# Patient Record
Sex: Female | Born: 1995 | Race: White | Hispanic: No | Marital: Single | Smoking: Former smoker
Health system: Southern US, Community
[De-identification: ages and names within clinical notes are randomized; demographics above are authoritative.]

## PROBLEM LIST (undated history)

## (undated) ENCOUNTER — Inpatient Hospital Stay: Payer: Self-pay

## (undated) DIAGNOSIS — F32A Depression, unspecified: Secondary | ICD-10-CM

## (undated) DIAGNOSIS — T7840XA Allergy, unspecified, initial encounter: Secondary | ICD-10-CM

## (undated) DIAGNOSIS — J189 Pneumonia, unspecified organism: Secondary | ICD-10-CM

## (undated) DIAGNOSIS — N289 Disorder of kidney and ureter, unspecified: Secondary | ICD-10-CM

## (undated) DIAGNOSIS — J45909 Unspecified asthma, uncomplicated: Secondary | ICD-10-CM

## (undated) DIAGNOSIS — F419 Anxiety disorder, unspecified: Secondary | ICD-10-CM

## (undated) DIAGNOSIS — F1990 Other psychoactive substance use, unspecified, uncomplicated: Secondary | ICD-10-CM

## (undated) DIAGNOSIS — B1920 Unspecified viral hepatitis C without hepatic coma: Secondary | ICD-10-CM

## (undated) DIAGNOSIS — T50901A Poisoning by unspecified drugs, medicaments and biological substances, accidental (unintentional), initial encounter: Secondary | ICD-10-CM

## (undated) DIAGNOSIS — F329 Major depressive disorder, single episode, unspecified: Secondary | ICD-10-CM

## (undated) HISTORY — DX: Unspecified viral hepatitis C without hepatic coma: B19.20

## (undated) HISTORY — DX: Depression, unspecified: F32.A

## (undated) HISTORY — DX: Allergy, unspecified, initial encounter: T78.40XA

## (undated) HISTORY — DX: Major depressive disorder, single episode, unspecified: F32.9

## (undated) HISTORY — DX: Anxiety disorder, unspecified: F41.9

## (undated) HISTORY — PX: WISDOM TOOTH EXTRACTION: SHX21

---
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
## 2014-05-18 LAB — OB RESULTS CONSOLE HEPATITIS B SURFACE ANTIGEN: Hepatitis B Surface Ag: NEGATIVE

## 2014-05-18 LAB — OB RESULTS CONSOLE GC/CHLAMYDIA
CHLAMYDIA, DNA PROBE: NEGATIVE
GC PROBE AMP, GENITAL: NEGATIVE

## 2014-05-18 LAB — OB RESULTS CONSOLE ABO/RH: RH TYPE: POSITIVE

## 2014-05-18 LAB — OB RESULTS CONSOLE HIV ANTIBODY (ROUTINE TESTING): HIV: NONREACTIVE

## 2014-05-18 LAB — OB RESULTS CONSOLE RUBELLA ANTIBODY, IGM: Rubella: IMMUNE

## 2014-05-18 LAB — OB RESULTS CONSOLE VARICELLA ZOSTER ANTIBODY, IGG: Varicella: IMMUNE

## 2014-07-14 DIAGNOSIS — S069XAA Unspecified intracranial injury with loss of consciousness status unknown, initial encounter: Secondary | ICD-10-CM

## 2014-07-14 HISTORY — DX: Unspecified intracranial injury with loss of consciousness status unknown, initial encounter: S06.9XAA

## 2014-07-14 NOTE — L&D Delivery Note (Signed)
VAGINAL DELIVERY NOTE:  Date of Delivery: 12/30/2014 Primary OB: [facility]  Gestational Age/EDD: [redacted]w[redacted]d 01/01/2015, Date entered prior to episode creation Antepartum complications: Preclampsia Attending Physician: Annamarie Major, MD, FACOG Delivery Type: spontaneous vaginal delivery  Anesthesia: epidural Laceration: vaginal Episiotomy: none Placenta: spontaneous Intrapartum complications: PIH- Preclampsia Estimated Blood Loss: GBS: POS Procedure Details: NSVD, repair bilat internal vaginal small lacerations  Baby: Liveborn female, Apgars 8/9, weight 6-6 lbs

## 2014-09-01 DIAGNOSIS — S0181XA Laceration without foreign body of other part of head, initial encounter: Secondary | ICD-10-CM | POA: Insufficient documentation

## 2014-09-21 ENCOUNTER — Encounter
Admit: 2014-09-21 | Disposition: A | Discharge status: Home / Self Care | Payer: Self-pay | Attending: Obstetrics & Gynecology | Admitting: Obstetrics & Gynecology

## 2014-09-26 ENCOUNTER — Observation Stay: Payer: Self-pay | Admitting: Obstetrics and Gynecology

## 2014-09-26 DIAGNOSIS — O99891 Other specified diseases and conditions complicating pregnancy: Secondary | ICD-10-CM | POA: Insufficient documentation

## 2014-10-10 LAB — OB RESULTS CONSOLE RPR: RPR: NONREACTIVE

## 2014-10-10 LAB — OB RESULTS CONSOLE HIV ANTIBODY (ROUTINE TESTING): HIV: NONREACTIVE

## 2014-10-13 ENCOUNTER — Encounter
Admit: 2014-10-13 | Disposition: A | Discharge status: Home / Self Care | Payer: Self-pay | Attending: Obstetrics & Gynecology | Admitting: Obstetrics & Gynecology

## 2014-10-17 ENCOUNTER — Encounter
Admit: 2014-10-17 | Disposition: A | Discharge status: Home / Self Care | Payer: Self-pay | Attending: Obstetrics and Gynecology | Admitting: Obstetrics and Gynecology

## 2014-10-22 ENCOUNTER — Encounter (HOSPITAL_COMMUNITY): Payer: Self-pay | Admitting: *Deleted

## 2014-10-22 ENCOUNTER — Emergency Department (HOSPITAL_COMMUNITY)
Admission: EM | Admit: 2014-10-22 | Discharge: 2014-10-22 | Disposition: A | Discharge status: Home / Self Care | Payer: 59 | Attending: Emergency Medicine | Admitting: Emergency Medicine

## 2014-10-22 DIAGNOSIS — Z79899 Other long term (current) drug therapy: Secondary | ICD-10-CM | POA: Diagnosis not present

## 2014-10-22 DIAGNOSIS — R1030 Lower abdominal pain, unspecified: Secondary | ICD-10-CM | POA: Diagnosis present

## 2014-10-22 DIAGNOSIS — N859 Noninflammatory disorder of uterus, unspecified: Secondary | ICD-10-CM

## 2014-10-22 DIAGNOSIS — N858 Other specified noninflammatory disorders of uterus: Secondary | ICD-10-CM | POA: Insufficient documentation

## 2014-10-22 LAB — URINALYSIS, ROUTINE W REFLEX MICROSCOPIC
Bilirubin Urine: NEGATIVE
Glucose, UA: NEGATIVE mg/dL
KETONES UR: NEGATIVE mg/dL
LEUKOCYTES UA: NEGATIVE
Nitrite: NEGATIVE
Protein, ur: NEGATIVE mg/dL
Specific Gravity, Urine: 1.002 — ABNORMAL LOW (ref 1.005–1.030)
UROBILINOGEN UA: 0.2 mg/dL (ref 0.0–1.0)
pH: 6.5 (ref 5.0–8.0)

## 2014-10-22 LAB — URINE MICROSCOPIC-ADD ON

## 2014-10-22 MED ORDER — NIFEDIPINE 10 MG PO CAPS
10.0000 mg | ORAL_CAPSULE | Freq: Once | ORAL | Status: AC
  Administered 2014-10-22: 10 mg via ORAL
  Filled 2014-10-22: qty 1

## 2014-10-22 MED ORDER — HYDROCODONE-ACETAMINOPHEN 5-325 MG PO TABS: 1.0000 | ORAL_TABLET | Freq: Four times a day (QID) | ORAL | Status: DC | PRN

## 2014-10-22 MED ORDER — HYDROCODONE-ACETAMINOPHEN 5-325 MG PO TABS
1.0000 | ORAL_TABLET | Freq: Once | ORAL | Status: AC
  Administered 2014-10-22: 1 via ORAL
  Filled 2014-10-22: qty 1

## 2014-10-22 MED ORDER — SODIUM CHLORIDE 0.9 % IV BOLUS (SEPSIS)
1000.0000 mL | Freq: Once | INTRAVENOUS | Status: AC
  Administered 2014-10-22: 1000 mL via INTRAVENOUS

## 2014-10-22 NOTE — Progress Notes (Signed)
.  9% NS hung and infusing at 12825ml/hr.

## 2014-10-22 NOTE — Discharge Instructions (Signed)
Please follow up with your primary care physician in 1-2 days. If you do not have one please call the [facility] number listed above.Please follow up with your Ob/Gyn to schedule a follow up appointment.  Please take pain medication and/or muscle relaxants as prescribed and as needed for pain. Please do not drive on narcotic pain medication or on muscle relaxants. Please read all discharge instructions and return precautions.   Abdominal Pain During Pregnancy Abdominal pain is common in pregnancy. Most of the time, it does not cause harm. There are many causes of abdominal pain. Some causes are more serious than others. Some of the causes of abdominal pain in pregnancy are easily diagnosed. Occasionally, the diagnosis takes time to understand. Other times, the cause is not determined. Abdominal pain can be a sign that something is very wrong with the pregnancy, or the pain may have nothing to do with the pregnancy at all. For this reason, always tell your health care provider if you have any abdominal discomfort. HOME CARE INSTRUCTIONS  Monitor your abdominal pain for any changes. The following actions may help to alleviate any discomfort you are experiencing:  Do not have sexual intercourse or put anything in your vagina until your symptoms go away completely.  Get plenty of rest until your pain improves.  Drink clear fluids if you feel nauseous. Avoid solid food as long as you are uncomfortable or nauseous.  Only take over-the-counter or prescription medicine as directed by your health care provider.  Keep all follow-up appointments with your health care provider. SEEK IMMEDIATE MEDICAL CARE IF:  You are bleeding, leaking fluid, or passing tissue from the vagina.  You have increasing pain or cramping.  You have persistent vomiting.  You have painful or bloody urination.  You have a fever.  You notice a decrease in your baby's movements.  You have extreme weakness  or feel faint.  You have shortness of breath, with or without abdominal pain.  You develop a severe headache with abdominal pain.  You have abnormal vaginal discharge with abdominal pain.  You have persistent diarrhea.  You have abdominal pain that continues even after rest, or gets worse. MAKE SURE YOU:   Understand these instructions.  Will watch your condition.  Will get help right away if you are not doing well or get worse. Document Released: 06/30/2005 Document Revised: 04/20/2013 Document Reviewed: 01/27/2013 [facility] Patient Information 2015 [place]. This information is not intended to replace advice given to you by your health care provider. Make sure you discuss any questions you have with your health care provider.

## 2014-10-22 NOTE — Progress Notes (Signed)
Spoke with Dr. Shawnie PonsPratt. Pt's cervix is closed, thk, midline,and about a -1,0 station. Will watch pt for another 30 min to 1 hour, recheck her cervix and if unchanged, pt can be d/c.

## 2014-10-22 NOTE — Progress Notes (Signed)
Spoke with Dr. Shawnie PonsPratt. Pt has occasional ui. No uc's. FHR is a category 1 tracing. Pt's cervix is closed. Pt is OB cleared. She has an appointment with her OB on 4/ 12/16.

## 2014-10-22 NOTE — Progress Notes (Signed)
Spoke with Dr. Shawnie PonsPratt. Occasional ui. No uc's. Cervix is closed. FHR is a category1 tracing. Pt has an appointment with her OB on 10/24/14. Pt is OB cleared. To be dc'd home by ED staff.

## 2014-10-22 NOTE — Progress Notes (Signed)
Spoke with Dr. Shawnie PonsPratt. Pt still having a lot of irritability after a liter of fluids. Orders received for procardia 10mg  po now. Will continue to monitor.
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
## 2014-10-22 NOTE — Progress Notes (Signed)
Pt is a G1P0 at 6429 6/[redacted] weeks gestation with c/o lower abd pain and back pain. States she has had no vomiting or diarreah. Denies vaginal bleeding or leaking of fluid. Says she has had regular prenatal visits with her [place] in [place]. She is here today visiting her grandmother when her abd and back pain started.

## 2014-10-22 NOTE — Progress Notes (Signed)
IV being started by ED staff.

## 2014-10-22 NOTE — ED Notes (Signed)
Pt reports onset this am of intermittent lower back pain and abd pain. Reports vaginal discharge but no bleeding. Pt is approx [redacted] weeks pregnant.

## 2014-10-22 NOTE — Progress Notes (Signed)
Procardia 10mg po 

## 2014-10-23 ENCOUNTER — Emergency Department
Admit: 2014-10-23 | Disposition: A | Discharge status: Home / Self Care | Payer: Self-pay | Admitting: Emergency Medicine

## 2014-10-23 LAB — URINE CULTURE

## 2014-11-16 ENCOUNTER — Encounter: Payer: 59 | Admitting: Physical Therapy

## 2014-11-23 ENCOUNTER — Encounter: Payer: 59 | Admitting: Physical Therapy

## 2014-11-28 ENCOUNTER — Encounter: Payer: Self-pay | Admitting: *Deleted

## 2014-11-28 ENCOUNTER — Encounter: Payer: 59 | Attending: Advanced Practice Midwife | Admitting: *Deleted

## 2014-11-28 VITALS — BP 110/78 | Ht 59.0 in | Wt 130.0 lb

## 2014-11-28 DIAGNOSIS — O24410 Gestational diabetes mellitus in pregnancy, diet controlled: Secondary | ICD-10-CM

## 2014-11-28 NOTE — Patient Instructions (Signed)
Read book on Gestational Diabetes Follow Gestational Meal Planning Guidelines Complete a 3 day Food Record and bring to next appointment Avoid fruit juices and sugar sweetened beverages Check blood sugars 4 x day - before eating and 2 hrs after every meal and record Call MD for prescription for meter strips and lancets Strips (type) One Touch Ultra Blue Lancets   One Touch Delica Walk 20-30 minutes at least 5 times a week if permitted by MD Next appointment  Wed May 25 @ 1:30 pm with Elita QuickPam (Dietitian)

## 2014-11-28 NOTE — Progress Notes (Signed)
Diabetes Self-Management Education  Visit Type:    Appt. Start Time: 1400 Appt. End Time: 1530  11/28/2014  Ms. Carolyn BoardsZoe [name], identified by name and date of birth, is a 19 y.o. female with a diagnosis of Diabetes: Gestational Diabetes.    ASSESSMENT  Blood pressure 110/78, height 4\' 11"  (1.499 m), weight 130 lb (58.968 kg), last menstrual period 03/23/2014. Body mass index is 26.24 kg/(m^2).  Initial Visit Information:  Are you currently following a meal plan?: No   Are you taking your medications as prescribed?: Yes Are you checking your feet?: No   How often do you need to have someone help you when you read instructions, pamphlets, or other written materials from your doctor or pharmacy?: 1 - Never What is the last grade level you completed in school?: 12 + some college  Psychosocial:  Patient Belief/Attitude about Diabetes: Motivated to manage diabetes Self-care barriers: None Self-management support: Doctor's office, Friends Patient Concerns: Monitoring, Glycemic Control, Problem Solving, Healthy Lifestyle Special Needs: None Preferred Learning Style: Visual, Auditory  Complications:   How often do you check your blood sugar?: 0 times/day (not testing) Provided One Touch Ultra Mini meter and instructed on use. BG in office was 116 mg/dL at 1:613:15 pm - 2 hrs pp. Have you had a dilated eye exam in the past 12 months?: No Have you had a dental exam in the past 12 months?: No  Diet Intake:  Breakfast: eats cereal and milk for breakfast Dinner: eats 6-7  servings of fried foods per week Beverage(s): drinks fruit juices and sugar sweetened beverages    Exercise:  Exercise: Light (walking / raking leaves) Light Exercise amount of time (min / week): 90  Individualized Plan for Diabetes Self-Management Training:   Learning Objective:  Patient will have a greater understanding of diabetes self-management.  Education Topics Reviewed with Patient Today:  Definition of  diabetes, type 1 and 2, and the diagnosis of diabetes Role of diet in the treatment of diabetes and the relationship between the three main macronutrients and blood glucose level Role of exercise on diabetes management, blood pressure control and cardiac health.   Taught/evaluated SMBG meter., Purpose and frequency of SMBG., Taught/discussed recording of test results and interpretation of SMBG.    Identified and addressed patients feelings and concerns about diabetes Pregnancy and GDM  Role of pre-pregnancy blood glucose control on the development of the fetus, Reviewed with patient blood glucose goals with pregnancy    PATIENTS GOALS/Plan (Developed by the patient):  Improve blood sugars Prevent diabetes complications Lead a healthier lifestyle  Plan:   Patient Instructions  Read book on Gestational Diabetes Follow Gestational Meal Planning Guidelines Complete a 3 day Food Record and bring to next appointment Avoid fruit juices and sugar sweetened beverages Check blood sugars 4 x day - before eating and 2 hrs after every meal and record Call MD for prescription for meter strips and lancets Strips (type) One Touch Ultra Blue Lancets   One Touch Delica Walk 20-30 minutes at least 5 times a week if permitted by MD Next appointment  Wed May 25 @ 1:30 pm with Pam (Dietitian)    Expected Outcomes:  Demonstrated interest in learning. Expect positive outcomes  Education material provided: Gestational diabetes booklet, Gestational Meal Planning Guidelines, One Touch Ultra Mini meter  If problems or questions, patient to contact team via:   Sharion SettlerSheila Ashunti Schofield, RN, CCM, CDE [phone]  Future DSME appointment: Dec 06, 2014

## 2014-12-05 ENCOUNTER — Inpatient Hospital Stay
Admission: EM | Admit: 2014-12-05 | Discharge: 2014-12-05 | Disposition: A | Discharge status: Home / Self Care | Payer: 59 | Attending: Obstetrics & Gynecology | Admitting: Obstetrics & Gynecology

## 2014-12-05 DIAGNOSIS — Z3A36 36 weeks gestation of pregnancy: Secondary | ICD-10-CM | POA: Diagnosis not present

## 2014-12-05 DIAGNOSIS — O99343 Other mental disorders complicating pregnancy, third trimester: Secondary | ICD-10-CM | POA: Insufficient documentation

## 2014-12-05 DIAGNOSIS — F329 Major depressive disorder, single episode, unspecified: Secondary | ICD-10-CM | POA: Diagnosis not present

## 2014-12-05 DIAGNOSIS — O479 False labor, unspecified: Secondary | ICD-10-CM

## 2014-12-05 LAB — URINALYSIS COMPLETE WITH MICROSCOPIC (ARMC ONLY)
Bilirubin Urine: NEGATIVE
Glucose, UA: NEGATIVE mg/dL
Ketones, ur: NEGATIVE mg/dL
Nitrite: NEGATIVE
Protein, ur: 30 mg/dL — AB
SPECIFIC GRAVITY, URINE: 1.015 (ref 1.005–1.030)
Trans Epithel, UA: 1
pH: 7 (ref 5.0–8.0)

## 2014-12-05 NOTE — Progress Notes (Signed)
Obstetrics Admission History & Physical   Abdominal Cramping   HPI:  19 y.o. G1P0 @ 1374w1d (01/01/2015, Date entered prior to episode creation). Admitted on 12/05/2014 (HD ):   Patient Active Problem List   Diagnosis Date Noted  . Braxton Hicks contractions 12/05/2014    Presents for ctxs.  No VB or ROM.   PMHx:  Past Medical History  Diagnosis Date  . Allergy   . Depression    PSHx: No past surgical history on file. Medications:  Prescriptions prior to admission  Medication Sig Dispense Refill Last Dose  . FLUoxetine (PROZAC) 10 MG capsule Take 10 mg by mouth daily.   Taking  . loratadine (CLARITIN) 10 MG tablet Take 10 mg by mouth daily.   Taking  . PRENATAL 28-0.8 MG TABS Take 1 tablet by mouth daily.    Taking  . cyclobenzaprine (FLEXERIL) 5 MG tablet Take 5 mg by mouth every 8 (eight) hours as needed for muscle spasms.   0 Not Taking  . HYDROcodone-acetaminophen (NORCO/VICODIN) 5-325 MG per tablet Take 1-2 tablets by mouth every 6 (six) hours as needed for severe pain. (Patient not taking: Reported on 11/28/2014) 15 tablet 0 Not Taking   Allergies: is allergic to advair diskus; sulfa antibiotics; prednisone; and banana. OBHx:  OB History  Gravida Para Term Preterm AB SAB TAB Ectopic Multiple Living  1             # Outcome Date GA Lbr Len/2nd Weight Sex Delivery Anes PTL Lv  1 Current              GYNHx: History of abnormal pap smears: No.               History of STIs: No..  WUJ:WJXBJYNW/GNFAOZHYQMVHFHx:Negative/unremarkable except as detailed in HPI. Soc Hx: Alcohol: none  Objective:   Filed Vitals:   12/05/14 1726  Temp: 98.4 F (36.9 C)   General: Well nourished, well developed female in no acute distress.  Skin: Warm and dry.  Cardiovascular:Regular rate and rhythm. Respiratory: Clear to auscultation bilateral. Normal respiratory effort Abdomen: mild Neuro/Psych: Normal mood and affect.   Pelvic exam: is not limited by body habitus EGBUS: within normal limits Vagina: within  normal limits and with normal mucosa blood in the vault Cervix: not dilated, thick Uterus: Uterus demonstrates irritability pattern.  Adnexa: normal adnexa  EFM:FHR: 130 bpm, variability: moderate,  accelerations:  Present,  decelerations:  Absent Toco: irreg  UA neg for UTI  Assessment & Plan:   19 y.o. G1P0 @ 6274w1d (01/01/2015, Date entered prior to episode creation). Admitted on 12/05/2014 (HD ):    1. FWB: FHT category 1.  2. Pain management: Analgesia and epidural discussed. 3. ID: GBSnot done. Treat with ABX if positive. 4. Labor management: Home, f/u.  Labor precautions.

## 2014-12-05 NOTE — OB Triage Note (Signed)
Received pt to OBS 1 with c/o lower abd pain x 2 days but worsened at 1500 today, burning with voiding, and light pink/brownish discharge when wiping since arrival to unit. Pt states + fetal movement. Pt has several bruises noted on left elbow, right eye and under chin and states its from her pitt bull dog. Pt states she has not fallen or been hit.

## 2014-12-05 NOTE — Discharge Instructions (Signed)
Come to hospital if increase in vaginal bleeding, leaking fluid or worsening contractions.  Braxton Hicks Contractions Contractions of the uterus can occur throughout pregnancy. Contractions are not always a sign that you are in labor.  WHAT ARE BRAXTON HICKS CONTRACTIONS?  Contractions that occur before labor are called Braxton Hicks contractions, or false labor. Toward the end of pregnancy (32-34 weeks), these contractions can develop more often and may become more forceful. This is not true labor because these contractions do not result in opening (dilatation) and thinning of the cervix. They are sometimes difficult to tell apart from true labor because these contractions can be forceful and people have different pain tolerances. You should not feel embarrassed if you go to the hospital with false labor. Sometimes, the only way to tell if you are in true labor is for your health care provider to look for changes in the cervix. If there are no prenatal problems or other health problems associated with the pregnancy, it is completely safe to be sent home with false labor and await the onset of true labor. HOW CAN YOU TELL THE DIFFERENCE BETWEEN TRUE AND FALSE LABOR? False Labor  The contractions of false labor are usually shorter and not as hard as those of true labor.   The contractions are usually irregular.   The contractions are often felt in the front of the lower abdomen and in the groin.   The contractions may go away when you walk around or change positions while lying down.   The contractions get weaker and are shorter lasting as time goes on.   The contractions do not usually become progressively stronger, regular, and closer together as with true labor.  True Labor  Contractions in true labor last 30-70 seconds, become very regular, usually become more intense, and increase in frequency.   The contractions do not go away with walking.   The discomfort is usually felt in  the top of the uterus and spreads to the lower abdomen and low back.   True labor can be determined by your health care provider with an exam. This will show that the cervix is dilating and getting thinner.  WHAT TO REMEMBER  Keep up with your usual exercises and follow other instructions given by your health care provider.   Take medicines as directed by your health care provider.   Keep your regular prenatal appointments.   Eat and drink lightly if you think you are going into labor.   If Braxton Hicks contractions are making you uncomfortable:   Change your position from lying down or resting to walking, or from walking to resting.   Sit and rest in a tub of warm water.   Drink 2-3 glasses of water. Dehydration may cause these contractions.   Do slow and deep breathing several times an hour.  WHEN SHOULD I SEEK IMMEDIATE MEDICAL CARE? Seek immediate medical care if:  Your contractions become stronger, more regular, and closer together.   You have fluid leaking or gushing from your vagina.   You have a fever.   You pass blood-tinged mucus.   You have vaginal bleeding.   You have continuous abdominal pain.   You have low back pain that you never had before.   You feel your baby's head pushing down and causing pelvic pressure.   Your baby is not moving as much as it used to.  Document Released: 06/30/2005 Document Revised: 07/05/2013 Document Reviewed: 04/11/2013 [facility] Patient Information 2015 [employer], [place]. This  information is not intended to replace advice given to you by your health care provider. Make sure you discuss any questions you have with your health care provider. ° °

## 2014-12-06 ENCOUNTER — Encounter: Payer: Self-pay | Admitting: Dietician

## 2014-12-06 ENCOUNTER — Encounter: Payer: 59 | Admitting: Dietician

## 2014-12-06 VITALS — BP 108/76 | Ht 59.0 in | Wt 130.4 lb

## 2014-12-06 DIAGNOSIS — O24410 Gestational diabetes mellitus in pregnancy, diet controlled: Secondary | ICD-10-CM | POA: Diagnosis not present

## 2014-12-06 NOTE — Progress Notes (Signed)
Patient's BG record indicates BGs within goal ranges. She had one low reading of 56 (fasting), states she felt nauseated until eating.     Patient's food diary indicates improved dietary intake, pt is making low fat choices and avoiding fried foods; she is avoiding beverages with sugar.   Provided 1800 kcal meal plan, and wrote individualized menus based on patient's food preferences. Instructed patient on food safety, including avoidance of Listeriosis, and limiting mercury from fish. Discussed importance of maintaining healthy lifestyle habits to reduce risk of Type 2 DM as well as Gestational DM with any future pregnancies. Advised patient to use any remaining testing supplies to test some BGs after delivery, and to have BG tested ideally annually, as well as prior to attempting future pregnancies.

## 2014-12-06 NOTE — Patient Instructions (Signed)
Include a protein food such as cheese or nuts with each meal and snack. Can increase carbohydrate intake to 3 servings with each meal; use food lists for options.  Eat something every 3-5 hours, avoid going longer than 5-6 hours without anything to eat.

## 2014-12-28 ENCOUNTER — Inpatient Hospital Stay
Admission: EM | Admit: 2014-12-28 | Discharge: 2014-12-28 | Disposition: A | Discharge status: Home / Self Care | Payer: 59 | Source: Home / Self Care | Admitting: Obstetrics & Gynecology

## 2014-12-28 DIAGNOSIS — Z3A39 39 weeks gestation of pregnancy: Secondary | ICD-10-CM | POA: Diagnosis present

## 2014-12-28 DIAGNOSIS — Z8249 Family history of ischemic heart disease and other diseases of the circulatory system: Secondary | ICD-10-CM

## 2014-12-28 DIAGNOSIS — Z79899 Other long term (current) drug therapy: Secondary | ICD-10-CM

## 2014-12-28 DIAGNOSIS — O24429 Gestational diabetes mellitus in childbirth, unspecified control: Secondary | ICD-10-CM | POA: Diagnosis present

## 2014-12-28 DIAGNOSIS — O99824 Streptococcus B carrier state complicating childbirth: Secondary | ICD-10-CM | POA: Diagnosis present

## 2014-12-28 DIAGNOSIS — O99344 Other mental disorders complicating childbirth: Secondary | ICD-10-CM | POA: Diagnosis present

## 2014-12-28 DIAGNOSIS — Z888 Allergy status to other drugs, medicaments and biological substances status: Secondary | ICD-10-CM

## 2014-12-28 DIAGNOSIS — O479 False labor, unspecified: Secondary | ICD-10-CM

## 2014-12-28 DIAGNOSIS — Z882 Allergy status to sulfonamides status: Secondary | ICD-10-CM

## 2014-12-28 DIAGNOSIS — O1493 Unspecified pre-eclampsia, third trimester: Principal | ICD-10-CM | POA: Diagnosis present

## 2014-12-28 DIAGNOSIS — F329 Major depressive disorder, single episode, unspecified: Secondary | ICD-10-CM | POA: Diagnosis present

## 2014-12-28 NOTE — Discharge Summary (Signed)
Physician Discharge Summary   Patient ID: Carolyn [name] [id number] 19 y.o. 19-Jul-1995  Admit date: 12/28/2014  Discharge date and time: No discharge date for patient encounter.   Admitting Physician: Nadara Mustard, MD   Discharge Physician: Tiburcio Pea  Admission Diagnoses: 39 wks preg contractions bleeding  Discharge Diagnoses: False Labor, Abdominal Pain  Admission Condition: good  Discharged Condition: good  Indication for Admission: Pregnant in pain  Hospital Course: NST, Monitoring, Cervical exams  Consults: None  Significant Diagnostic Studies: none  Treatments: hydration  Discharge Exam: LMP 03/23/2014  General Appearance:    Alert, cooperative, no distress, appears stated age  Head:    Normocephalic, without obvious abnormality, atraumatic  Eyes:    PERRL, conjunctiva/corneas clear, EOM's intact, fundi    benign, both eyes  Ears:    Normal TM's and external ear canals, both ears  Nose:   Nares normal, septum midline, mucosa normal, no drainage    or sinus tenderness  Throat:   Lips, mucosa, and tongue normal; teeth and gums normal  Neck:   Supple, symmetrical, trachea midline, no adenopathy;    thyroid:  no enlargement/tenderness/nodules; no carotid   bruit or JVD  Back:     Symmetric, no curvature, ROM normal, no CVA tenderness  Lungs:     Clear to auscultation bilaterally, respirations unlabored  Chest Wall:    No tenderness or deformity   [name]:    Regular rate and rhythm, S1 and S2 normal, no murmur, rub   or gallop  Breast Exam:    No tenderness, masses, or nipple abnormality  Abdomen:     Soft, non-tender, bowel sounds active all four quadrants,    no masses, no organomegaly  Genitalia:    Normal female without lesion, discharge or tenderness  Rectal:    Normal tone, normal prostate, no masses or tenderness;   guaiac negative stool  Extremities:   Extremities normal, atraumatic, no cyanosis or edema  Pulses:   2+ and symmetric all extremities  Skin:    Skin color, texture, turgor normal, no rashes or lesions  Lymph nodes:   Cervical, supraclavicular, and axillary nodes normal  Neurologic:   CNII-XII intact, normal strength, sensation and reflexes    throughout    Disposition: 01-Home or Self Care  Patient Instructions:    Medication List    ASK your doctor about these medications        cyclobenzaprine 5 MG tablet  Commonly known as:  FLEXERIL  Take 5 mg by mouth every 8 (eight) hours as needed for muscle spasms.     FLUoxetine 10 MG capsule  Commonly known as:  PROZAC  Take 10 mg by mouth daily.     HYDROcodone-acetaminophen 5-325 MG per tablet  Commonly known as:  NORCO/VICODIN  Take 1-2 tablets by mouth every 6 (six) hours as needed for severe pain.     loratadine 10 MG tablet  Commonly known as:  CLARITIN  Take 10 mg by mouth daily.     PRENATAL 28-0.8 MG Tabs  Take 1 tablet by mouth daily.       Activity: activity as tolerated Diet: regular diet Wound Care: none needed  Follow-up within 4days   Signed: Zulema Pulaski PAUL 12/28/2014 2:24 PM

## 2014-12-28 NOTE — H&P (Signed)
Obstetrics Admission History & Physical   No chief complaint on file.   HPI:  19 y.o. G1P0 @ [redacted]w[redacted]d (01/01/2015, Date entered prior to episode creation). Admitted on 12/28/2014:   Patient Active Problem List   Diagnosis Date Noted  . Braxton Hicks contractions 12/05/2014     Presents for contractions.  Prenatal care at: at [facility]  PMHx:  Past Medical History  Diagnosis Date  . Allergy   . Depression    PSHx: No past surgical history on file. Medications:  Prescriptions prior to admission  Medication Sig Dispense Refill Last Dose  . cyclobenzaprine (FLEXERIL) 5 MG tablet Take 5 mg by mouth every 8 (eight) hours as needed for muscle spasms.   0 Not Taking  . FLUoxetine (PROZAC) 10 MG capsule Take 10 mg by mouth daily.   Taking  . HYDROcodone-acetaminophen (NORCO/VICODIN) 5-325 MG per tablet Take 1-2 tablets by mouth every 6 (six) hours as needed for severe pain. (Patient not taking: Reported on 11/28/2014) 15 tablet 0 Not Taking  . loratadine (CLARITIN) 10 MG tablet Take 10 mg by mouth daily.   Taking  . PRENATAL 28-0.8 MG TABS Take 1 tablet by mouth daily.    Taking   Allergies: is allergic to advair diskus; sulfa antibiotics; prednisone; and banana. OBHx:  OB History  Gravida Para Term Preterm AB SAB TAB Ectopic Multiple Living  1             # Outcome Date GA Lbr Len/2nd Weight Sex Delivery Anes PTL Lv  1 Current              KFE:XMDYJWLK/HVFMBBUYZJQD except as detailed in HPI. Soc Hx: Pregnancy welcomed  Objective:  There were no vitals filed for this visit. General: Well nourished, well developed female in no acute distress.  Skin: Warm and dry.  Cardiovascular:Regular rate and rhythm. Respiratory: Clear to auscultation bilateral. Normal respiratory effort Abdomen: mild Neuro/Psych: Normal mood and affect.   Pelvic exam: is not limited by body habitus EGBUS: within normal limits Vagina: within normal limits and with normal mucosa blood in the vault Cervix:  <0.5 cm Uterus: Uterus demonstrates irritability pattern.  Adnexa: normal adnexa  EFM:FHR: 140 bpm, variability: moderate,  accelerations:  Present,  decelerations:  Absent Toco: None   Perinatal info:  Blood type: O positive Rubella- Immune Varicella -Immune TDaP Given during third trimester of this pregnancy RPR NR / HIV Neg/ HBsAg Neg   Assessment & Plan:   19 y.o. G1P0 @ [redacted]w[redacted]d, Admitted on 12/28/2014: abdominal pain, contractions.     Observe for cervical change and Fetal Wellbeing Reassuring

## 2014-12-29 ENCOUNTER — Inpatient Hospital Stay
Admission: EM | Admit: 2014-12-29 | Discharge: 2015-01-01 | DRG: 774 | Disposition: A | Discharge status: Home / Self Care | Payer: 59 | Attending: Obstetrics & Gynecology | Admitting: Obstetrics & Gynecology

## 2014-12-29 DIAGNOSIS — O1414 Severe pre-eclampsia complicating childbirth: Secondary | ICD-10-CM

## 2014-12-29 DIAGNOSIS — R1084 Generalized abdominal pain: Secondary | ICD-10-CM

## 2014-12-29 DIAGNOSIS — R109 Unspecified abdominal pain: Secondary | ICD-10-CM | POA: Diagnosis present

## 2014-12-29 DIAGNOSIS — Z8759 Personal history of other complications of pregnancy, childbirth and the puerperium: Secondary | ICD-10-CM

## 2014-12-29 LAB — COMPREHENSIVE METABOLIC PANEL
ALT: 11 U/L — ABNORMAL LOW (ref 14–54)
AST: 28 U/L (ref 15–41)
Albumin: 3.1 g/dL — ABNORMAL LOW (ref 3.5–5.0)
Alkaline Phosphatase: 350 U/L — ABNORMAL HIGH (ref 38–126)
Anion gap: 8 (ref 5–15)
BILIRUBIN TOTAL: 0.2 mg/dL — AB (ref 0.3–1.2)
BUN: 6 mg/dL (ref 6–20)
CO2: 22 mmol/L (ref 22–32)
CREATININE: 0.61 mg/dL (ref 0.44–1.00)
Calcium: 8.8 mg/dL — ABNORMAL LOW (ref 8.9–10.3)
Chloride: 105 mmol/L (ref 101–111)
GFR calc Af Amer: >60 mL/min (ref >60)
Glucose, Bld: 84 mg/dL (ref 65–99)
POTASSIUM: 3.4 mmol/L — AB (ref 3.5–5.1)
SODIUM: 135 mmol/L (ref 135–145)
Total Protein: 6.4 g/dL — ABNORMAL LOW (ref 6.5–8.1)

## 2014-12-29 LAB — CBC
HEMATOCRIT: 38 % (ref 35.0–47.0)
HEMOGLOBIN: 12.4 g/dL (ref 12.0–16.0)
MCH: 27.7 pg (ref 26.0–34.0)
MCHC: 32.7 g/dL (ref 32.0–36.0)
MCV: 84.9 fL (ref 80.0–100.0)
Platelets: 166 10*3/uL (ref 150–440)
RBC: 4.47 MIL/uL (ref 3.80–5.20)
RDW: 15.7 % — ABNORMAL HIGH (ref 11.5–14.5)
WBC: 12.1 10*3/uL — AB (ref 3.6–11.0)

## 2014-12-29 LAB — OB RESULTS CONSOLE GBS: STREP GROUP B AG: POSITIVE
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
## 2014-12-29 NOTE — OB Triage Note (Signed)
Patient complaining of contractions since 1800.  Five mins apart

## 2014-12-30 ENCOUNTER — Inpatient Hospital Stay: Payer: 59 | Admitting: Anesthesiology

## 2014-12-30 DIAGNOSIS — Z79899 Other long term (current) drug therapy: Secondary | ICD-10-CM | POA: Diagnosis not present

## 2014-12-30 DIAGNOSIS — Z888 Allergy status to other drugs, medicaments and biological substances status: Secondary | ICD-10-CM | POA: Diagnosis not present

## 2014-12-30 DIAGNOSIS — O24429 Gestational diabetes mellitus in childbirth, unspecified control: Secondary | ICD-10-CM | POA: Diagnosis present

## 2014-12-30 DIAGNOSIS — O99824 Streptococcus B carrier state complicating childbirth: Secondary | ICD-10-CM | POA: Diagnosis present

## 2014-12-30 DIAGNOSIS — Z882 Allergy status to sulfonamides status: Secondary | ICD-10-CM | POA: Diagnosis not present

## 2014-12-30 DIAGNOSIS — O99344 Other mental disorders complicating childbirth: Secondary | ICD-10-CM | POA: Diagnosis present

## 2014-12-30 DIAGNOSIS — Z8249 Family history of ischemic heart disease and other diseases of the circulatory system: Secondary | ICD-10-CM | POA: Diagnosis not present

## 2014-12-30 DIAGNOSIS — Z3A39 39 weeks gestation of pregnancy: Secondary | ICD-10-CM | POA: Diagnosis present

## 2014-12-30 DIAGNOSIS — O1493 Unspecified pre-eclampsia, third trimester: Secondary | ICD-10-CM | POA: Diagnosis present

## 2014-12-30 DIAGNOSIS — F329 Major depressive disorder, single episode, unspecified: Secondary | ICD-10-CM | POA: Diagnosis present

## 2014-12-30 LAB — TYPE AND SCREEN
ABO/RH(D): O POS
ANTIBODY SCREEN: NEGATIVE

## 2014-12-30 LAB — URINE DRUG SCREEN, QUALITATIVE (ARMC ONLY)
AMPHETAMINES, UR SCREEN: NOT DETECTED
Barbiturates, Ur Screen: NOT DETECTED
Benzodiazepine, Ur Scrn: NOT DETECTED
CANNABINOID 50 NG, UR ~~LOC~~: NOT DETECTED
Cocaine Metabolite,Ur ~~LOC~~: NOT DETECTED
MDMA (Ecstasy)Ur Screen: NOT DETECTED
METHADONE SCREEN, URINE: NOT DETECTED
Opiate, Ur Screen: NOT DETECTED
Phencyclidine (PCP) Ur S: NOT DETECTED
Tricyclic, Ur Screen: NOT DETECTED

## 2014-12-30 LAB — PLATELET COUNT: Platelets: 169 10*3/uL (ref 150–440)

## 2014-12-30 LAB — PROTEIN / CREATININE RATIO, URINE
Creatinine, Urine: 85 mg/dL
Protein Creatinine Ratio: 0.6 mg/mg{Cre} — ABNORMAL HIGH (ref 0.00–0.15)
Total Protein, Urine: 51 mg/dL

## 2014-12-30 LAB — ABO/RH: ABO/RH(D): O POS

## 2014-12-30 MED ORDER — TERBUTALINE SULFATE 1 MG/ML IJ SOLN: 0.2500 mg | Freq: Once | INTRAMUSCULAR | Status: AC | PRN

## 2014-12-30 MED ORDER — LIDOCAINE HCL (PF) 1 % IJ SOLN: 30.0000 mL | INTRAMUSCULAR | Status: DC | PRN

## 2014-12-30 MED ORDER — BUTORPHANOL TARTRATE 1 MG/ML IJ SOLN
1.0000 mg | INTRAMUSCULAR | Status: DC | PRN
  Administered 2014-12-30 (×3): 1 mg via INTRAVENOUS

## 2014-12-30 MED ORDER — BUTORPHANOL TARTRATE 1 MG/ML IJ SOLN
INTRAMUSCULAR | Status: AC
  Administered 2014-12-30: 1 mg via INTRAVENOUS
  Filled 2014-12-30: qty 1

## 2014-12-30 MED ORDER — OXYTOCIN 40 UNITS IN LACTATED RINGERS INFUSION - SIMPLE MED
1.0000 m[IU]/min | INTRAVENOUS | Status: DC
  Administered 2014-12-30: 11 m[IU]/min via INTRAVENOUS
  Administered 2014-12-30: 1 m[IU]/min via INTRAVENOUS
  Administered 2014-12-30: 13 m[IU]/min via INTRAVENOUS

## 2014-12-30 MED ORDER — ACETAMINOPHEN 325 MG PO TABS: 650.0000 mg | ORAL_TABLET | ORAL | Status: DC | PRN

## 2014-12-30 MED ORDER — LIDOCAINE HCL (PF) 1 % IJ SOLN
INTRAMUSCULAR | Status: AC
  Filled 2014-12-30: qty 30

## 2014-12-30 MED ORDER — SODIUM CHLORIDE 0.9 % IV SOLN
1.0000 g | INTRAVENOUS | Status: DC
  Administered 2014-12-30 (×4): 1 g via INTRAVENOUS

## 2014-12-30 MED ORDER — DIPHENHYDRAMINE HCL 50 MG/ML IJ SOLN: 12.5000 mg | INTRAMUSCULAR | Status: DC | PRN

## 2014-12-30 MED ORDER — SODIUM CHLORIDE 0.9 % IV SOLN
2.0000 g | Freq: Once | INTRAVENOUS | Status: AC
  Administered 2014-12-30: 2 g via INTRAVENOUS

## 2014-12-30 MED ORDER — AMMONIA AROMATIC IN INHA
RESPIRATORY_TRACT | Status: AC
Start: 2014-12-30 — End: 2014-12-31
  Filled 2014-12-30: qty 10

## 2014-12-30 MED ORDER — OXYTOCIN 40 UNITS IN LACTATED RINGERS INFUSION - SIMPLE MED
62.5000 mL/h | INTRAVENOUS | Status: DC
  Administered 2014-12-30: 999 mL/h via INTRAVENOUS
  Administered 2014-12-30: 62.5 mL/h via INTRAVENOUS

## 2014-12-30 MED ORDER — MAGNESIUM SULFATE 50 % IJ SOLN
2.0000 g/h | INTRAVENOUS | Status: DC
  Administered 2014-12-30 (×2): 2 g/h via INTRAVENOUS
  Filled 2014-12-30: qty 80

## 2014-12-30 MED ORDER — EPHEDRINE 5 MG/ML INJ: 10.0000 mg | INTRAVENOUS | Status: DC | PRN

## 2014-12-30 MED ORDER — BUTORPHANOL TARTRATE 1 MG/ML IJ SOLN
INTRAMUSCULAR | Status: AC
  Filled 2014-12-30: qty 1

## 2014-12-30 MED ORDER — MAGNESIUM SULFATE 40 G IN LACTATED RINGERS - SIMPLE
INTRAVENOUS | Status: AC
  Filled 2014-12-30: qty 500

## 2014-12-30 MED ORDER — ONDANSETRON HCL 4 MG/2ML IJ SOLN
4.0000 mg | Freq: Four times a day (QID) | INTRAMUSCULAR | Status: DC | PRN
  Administered 2014-12-30: 4 mg via INTRAVENOUS

## 2014-12-30 MED ORDER — HYDRALAZINE HCL 20 MG/ML IJ SOLN: 10.0000 mg | INTRAMUSCULAR | Status: DC | PRN

## 2014-12-30 MED ORDER — LACTATED RINGERS IV SOLN: 500.0000 mL | INTRAVENOUS | Status: DC | PRN

## 2014-12-30 MED ORDER — SODIUM CHLORIDE 0.9 % IV SOLN
INTRAVENOUS | Status: AC
  Filled 2014-12-30: qty 1000

## 2014-12-30 MED ORDER — FENTANYL 2.5 MCG/ML BUPIVACAINE 1/10 % EPIDURAL INFUSION (WH - ANES): 10.0000 mL/h | INTRAMUSCULAR | Status: DC | PRN

## 2014-12-30 MED ORDER — PHENYLEPHRINE 40 MCG/ML (10ML) SYRINGE FOR IV PUSH (FOR BLOOD PRESSURE SUPPORT): 80.0000 ug | PREFILLED_SYRINGE | INTRAVENOUS | Status: DC | PRN

## 2014-12-30 MED ORDER — ONDANSETRON HCL 4 MG/2ML IJ SOLN
INTRAMUSCULAR | Status: AC
  Administered 2014-12-30: 4 mg via INTRAVENOUS
  Filled 2014-12-30: qty 2

## 2014-12-30 MED ORDER — FENTANYL 2.5 MCG/ML W/ROPIVACAINE 0.2% IN NS 100 ML EPIDURAL INFUSION (ARMC-ANES): 10.0000 mL/h | EPIDURAL | Status: DC

## 2014-12-30 MED ORDER — OXYTOCIN 40 UNITS IN LACTATED RINGERS INFUSION - SIMPLE MED
INTRAVENOUS | Status: AC
  Administered 2014-12-30: 11 m[IU]/min via INTRAVENOUS
  Filled 2014-12-30: qty 1000

## 2014-12-30 MED ORDER — MISOPROSTOL 200 MCG PO TABS
ORAL_TABLET | ORAL | Status: AC
  Filled 2014-12-30: qty 4

## 2014-12-30 MED ORDER — FENTANYL 2.5 MCG/ML W/ROPIVACAINE 0.2% IN NS 100 ML EPIDURAL INFUSION (ARMC-ANES)
EPIDURAL | Status: AC
  Administered 2014-12-30: 10 mL/h via EPIDURAL
  Filled 2014-12-30: qty 100

## 2014-12-30 MED ORDER — OXYTOCIN 10 UNIT/ML IJ SOLN
INTRAMUSCULAR | Status: AC
  Filled 2014-12-30: qty 2

## 2014-12-30 MED ORDER — SODIUM CHLORIDE 0.9 % IV SOLN
INTRAVENOUS | Status: AC
  Administered 2014-12-30: 2 g via INTRAVENOUS
  Filled 2014-12-30: qty 2000

## 2014-12-30 MED ORDER — OXYTOCIN BOLUS FROM INFUSION: 500.0000 mL | INTRAVENOUS | Status: DC

## 2014-12-30 MED ORDER — AMPICILLIN SODIUM 1 G IJ SOLR
INTRAMUSCULAR | Status: AC
  Filled 2014-12-30: qty 1000

## 2014-12-30 MED ORDER — DINOPROSTONE 10 MG VA INST
10.0000 mg | VAGINAL_INSERT | Freq: Once | VAGINAL | Status: AC
  Administered 2014-12-30: 10 mg via VAGINAL
  Filled 2014-12-30: qty 1

## 2014-12-30 MED ORDER — MAGNESIUM SULFATE BOLUS VIA INFUSION
6.0000 g | Freq: Once | INTRAVENOUS | Status: AC
  Administered 2014-12-30: 6 g via INTRAVENOUS
  Filled 2014-12-30: qty 500

## 2014-12-30 MED ORDER — CITRIC ACID-SODIUM CITRATE 334-500 MG/5ML PO SOLN: 30.0000 mL | ORAL | Status: DC | PRN

## 2014-12-30 MED ORDER — HYDRALAZINE HCL 20 MG/ML IJ SOLN: 5.0000 mg | Freq: Once | INTRAMUSCULAR | Status: AC | PRN

## 2014-12-30 MED ORDER — SODIUM CHLORIDE 0.9 % IV SOLN
INTRAVENOUS | Status: AC
  Administered 2014-12-30: 1 g via INTRAVENOUS
  Filled 2014-12-30: qty 1000

## 2014-12-30 MED ORDER — LACTATED RINGERS IV SOLN
INTRAVENOUS | Status: DC
  Administered 2014-12-30 (×2): via INTRAVENOUS

## 2014-12-30 NOTE — Anesthesia Preprocedure Evaluation (Signed)
Anesthesia Evaluation  Patient identified by MRN, date of birth, ID band Patient awake    Reviewed: Allergy & Precautions, Patient's Chart, lab work & pertinent test results  History of Anesthesia Complications Negative for: history of anesthetic complications  Airway Mallampati: II  TM Distance: >3 FB Neck ROM: Full    Dental  (+) Teeth Intact   Pulmonary          Cardiovascular hypertension (PIH),     Neuro/Psych Depression    GI/Hepatic   Endo/Other  diabetes, Gestational  Renal/GU      Musculoskeletal   Abdominal   Peds  Hematology   Anesthesia Other Findings   Reproductive/Obstetrics (+) Pregnancy                             Anesthesia Physical Anesthesia Plan  ASA: II  Anesthesia Plan: Epidural   Post-op Pain Management:    Induction:   Airway Management Planned:   Additional Equipment:   Intra-op Plan:   Post-operative Plan:   Informed Consent: I have reviewed the patients History and Physical, chart, labs and discussed the procedure including the risks, benefits and alternatives for the proposed anesthesia with the patient or authorized representative who has indicated his/her understanding and acceptance.     Plan Discussed with:   Anesthesia Plan Comments:         Anesthesia Quick Evaluation

## 2014-12-30 NOTE — Progress Notes (Signed)
  Labor Progress Note   19 y.o. G1P0 @ [redacted]w[redacted]d , admitted for  Pregnancy, Labor Management. Preclampsia.  Subjective:  Pain w ctxs.  Cervadil in place.  Objective:  BP 131/86 mmHg  Pulse 92  Temp(Src) 98 F (36.7 C) (Oral)  Resp 20  Ht 5\' 2"  (1.575 m)  Wt 60.782 kg (134 lb)  BMI 24.50 kg/m2  LMP 03/23/2014 Abd: moderate Extr: trace to 1+ bilateral pedal edema SVE: CERVIX: 3 cm dilated, 80% effaced, -2 station, cervical position mid, presenting part Vtx MEMBRANES: intact  EFM: FHR: 110 bpm, variability: moderate,  accelerations:  Present,  decelerations:  Absent Toco: Frequency: Every 4 minutes Labs: I have reviewed the patient's lab results.   Assessment & Plan:  G1P0 @ [redacted]w[redacted]d, admitted for  Pregnancy and Labor/Delivery Management  1. Pain management: IV sedation. 2. FWB: FHT category 1.  3. ID: GBS pos, ABX gv 4. Labor management: Cont MAG for Preclampsia.  Cervadil removed.  Pitocin if necessary.  Epidural when desired.  All discussed with patient, see orders

## 2014-12-30 NOTE — Discharge Summary (Signed)
  Obstetrical Discharge Summary  Date of Admission: 12/29/2014 Date of Discharge: 01/01/2015 Discharge Diagnosis: Term Pregnancy-delivered and Preelampsia Primary OB:  [facility]   Gestational Age at Delivery: [redacted]w[redacted]d  Antepartum complications: Preclampsia Date of Delivery: 618/16   Delivered By: Prudencio Burly, MD Delivery Type: spontaneous vaginal delivery Intrapartum complications/course: None  Anesthesia: epidural Placenta: spontaneous Laceration: vaginal Episiotomy: none Baby: Liveborn female, Apgars 8/9, weight 6-6 g.   Post partum course: Pt's blood pressure stable since discontinuing magnesium gtt. Since the delivery, patient has tolerate activity, diet, and daily functions without difficulty or complication.  Min lochia.  No breast concerns at this time.  No signs of depression currently.   Disposition: home with infant Rh Immune globulin given: NA Rubella vaccine given: not applicable Varicella vaccine given: not applicable Tdap vaccine given in AP or PP setting: yes Flu vaccine given in AP or PP setting: not applicable Contraception: oral contraceptives (estrogen/progesterone), to be determined at post partum visit  Prenatal Labs: Conflict (See Lab Report): O POS/O POS//Rubella Immune//RPR negative//HIV negative/HepB Surface Ag negative///plans to bottle feed//female  Plan:  Carolyn [name] was discharged to home in good condition. Follow-up appointment with [facility] provider in 1 week  Discharge Medications:   Medication List    ASK your doctor about these medications        PRENATAL 28-0.8 MG Tabs  Take 1 tablet by mouth daily.        Follow-up arrangements:   1 week with [facility] for BP check. Also needs 2 hour gtt at 6 weeks pp.    No future appointments.   Jannet Mantis, CNM [facility]

## 2014-12-30 NOTE — Progress Notes (Signed)
  Labor Progress Note   19 y.o. G1P0 @ [redacted]w[redacted]d , admitted for  Pregnancy, Labor Management. Preclampsia  Subjective:  Comfy w Epidural  Objective:  BP 123/81 mmHg  Pulse 86  Temp(Src) 98 F (36.7 C) (Oral)  Resp 20  Ht 5\' 2"  (1.575 m)  Wt 60.782 kg (134 lb)  BMI 24.50 kg/m2  LMP 03/23/2014 Abd: gravid NT Extr: trace to 1+ bilateral pedal edema SVE: CERVIX: 6 cm dilated, 90 effaced, 0+ station MEMBRANES: ruptured, clear fluid  EFM: FHR: 120 bpm, variability: moderate,  accelerations:  Present,  decelerations:  Absent Toco: Frequency: Every 3 minutes on Pitocin 8 mU/min Labs:  Current lab results:  Recent Labs  12/29/14 2330  WBC 12.1*  HGB 12.4  HCT 38.0     Assessment & Plan:  G1P0 @ [redacted]w[redacted]d, admitted for  Pregnancy and Labor/Delivery Management  1. Pain management: epidural. 2. FWB: FHT category 1.  3. ID: GBS positive 4. Labor management: AROM, cont Pitocin  All discussed with patient, see orders

## 2014-12-30 NOTE — Anesthesia Procedure Notes (Signed)
Epidural Patient location during procedure: OB  Staffing Performed by: anesthesiologist   Preanesthetic Checklist Completed: patient identified, site marked, surgical consent, pre-op evaluation, timeout performed, IV checked, risks and benefits discussed and monitors and equipment checked  Epidural Patient position: sitting Prep: Betadine Patient monitoring: heart rate, continuous pulse ox and blood pressure Approach: midline Location: L4-L5 Injection technique: LOR saline  Needle:  Needle type: Tuohy  Needle gauge: 18 G Needle length: 9 cm and 9 Catheter type: closed end flexible Catheter size: 20 Guage Catheter at skin depth: 7 cm Test dose: negative and 1.5% lidocaine with Epi 1:200 K  Assessment Sensory level: T10 Events: blood not aspirated, injection not painful, no injection resistance, negative IV test and no paresthesia  Additional Notes   Patient tolerated the insertion well without complications.Reason for block:procedure for pain

## 2014-12-30 NOTE — Progress Notes (Signed)
  Labor Progress Note   19 y.o. G1P0 @ [redacted]w[redacted]d , admitted for  Pregnancy, Labor Management. Preclampsia by BPs, and urine ratio 600  Subjective:  Pain 5/10 ctxs every 4 min, s/p Stadol for pain Pt has CERVADIL in place (3 hours)  Objective:  BP 128/89 mmHg  Pulse 97  Temp(Src) 97.7 F (36.5 C) (Oral)  Resp 16  Ht 5\' 2"  (1.575 m)  Wt 60.782 kg (134 lb)  BMI 24.50 kg/m2  LMP 03/23/2014 Abd: mild Extr: trace to 1+ bilateral pedal edema SVE: deferred  EFM: FHR: 110 bpm, variability: moderate,  accelerations:  Present,  decelerations:  Absent Toco: Frequency: Every 4-5 minutes Labs: I have reviewed the patient's lab results.   Assessment & Plan:  G1P0 @ [redacted]w[redacted]d, admitted for  Pregnancy and Labor/Delivery Management  1. Pain management: IV sedation. 2. FWB: FHT category 1.  3. ID: GBS positive 4. Labor management: Cervadil for now.  Change to Pitocin when cervical change occurs  All discussed with patient, see orders

## 2014-12-31 ENCOUNTER — Encounter: Payer: Self-pay | Admitting: Certified Registered Nurse Anesthetist

## 2014-12-31 LAB — CBC
HCT: 31.3 % — ABNORMAL LOW (ref 35.0–47.0)
HEMOGLOBIN: 10.2 g/dL — AB (ref 12.0–16.0)
MCH: 27.7 pg (ref 26.0–34.0)
MCHC: 32.6 g/dL (ref 32.0–36.0)
MCV: 85.1 fL (ref 80.0–100.0)
Platelets: 137 10*3/uL — ABNORMAL LOW (ref 150–440)
RBC: 3.68 MIL/uL — AB (ref 3.80–5.20)
RDW: 16 % — AB (ref 11.5–14.5)
WBC: 11 10*3/uL (ref 3.6–11.0)

## 2014-12-31 LAB — RPR: RPR Ser Ql: NONREACTIVE

## 2014-12-31 MED ORDER — ONDANSETRON HCL 4 MG PO TABS
4.0000 mg | ORAL_TABLET | ORAL | Status: DC | PRN
  Filled 2014-12-31: qty 1

## 2014-12-31 MED ORDER — OXYTOCIN 40 UNITS IN LACTATED RINGERS INFUSION - SIMPLE MED
62.5000 mL/h | INTRAVENOUS | Status: DC | PRN
Start: 2014-12-31 — End: 2015-01-01
  Administered 2014-12-31: 62.5 mL/h via INTRAVENOUS

## 2014-12-31 MED ORDER — SODIUM CHLORIDE 0.9 % IJ SOLN
3.0000 mL | Freq: Two times a day (BID) | INTRAMUSCULAR | Status: DC
  Administered 2014-12-31: 3 mL via INTRAVENOUS

## 2014-12-31 MED ORDER — ZOLPIDEM TARTRATE 5 MG PO TABS: 5.0000 mg | ORAL_TABLET | Freq: Every evening | ORAL | Status: DC | PRN

## 2014-12-31 MED ORDER — PRENATAL MULTIVITAMIN CH
1.0000 | ORAL_TABLET | Freq: Every day | ORAL | Status: DC
  Administered 2014-12-31 – 2015-01-01 (×2): 1 via ORAL
  Filled 2014-12-31: qty 1

## 2014-12-31 MED ORDER — IBUPROFEN 600 MG PO TABS
ORAL_TABLET | ORAL | Status: AC
  Administered 2014-12-31: 600 mg via ORAL
  Filled 2014-12-31: qty 1

## 2014-12-31 MED ORDER — DIPHENHYDRAMINE HCL 25 MG PO CAPS: 25.0000 mg | ORAL_CAPSULE | Freq: Four times a day (QID) | ORAL | Status: DC | PRN

## 2014-12-31 MED ORDER — SIMETHICONE 80 MG PO CHEW
80.0000 mg | CHEWABLE_TABLET | ORAL | Status: DC | PRN
  Filled 2014-12-31: qty 1

## 2014-12-31 MED ORDER — ONDANSETRON HCL 4 MG/2ML IJ SOLN: 4.0000 mg | INTRAMUSCULAR | Status: DC | PRN

## 2014-12-31 MED ORDER — SODIUM CHLORIDE 0.9 % IV SOLN: 250.0000 mL | INTRAVENOUS | Status: DC | PRN

## 2014-12-31 MED ORDER — WITCH HAZEL-GLYCERIN EX PADS: 1.0000 "application " | MEDICATED_PAD | CUTANEOUS | Status: DC | PRN

## 2014-12-31 MED ORDER — IBUPROFEN 600 MG PO TABS
600.0000 mg | ORAL_TABLET | Freq: Four times a day (QID) | ORAL | Status: DC
  Administered 2014-12-31 – 2015-01-01 (×6): 600 mg via ORAL
  Filled 2014-12-31 (×10): qty 1

## 2014-12-31 MED ORDER — OXYCODONE-ACETAMINOPHEN 5-325 MG PO TABS: 1.0000 | ORAL_TABLET | ORAL | Status: DC | PRN

## 2014-12-31 MED ORDER — SENNOSIDES-DOCUSATE SODIUM 8.6-50 MG PO TABS
2.0000 | ORAL_TABLET | ORAL | Status: DC
  Administered 2014-12-31: 2 via ORAL
  Filled 2014-12-31 (×2): qty 2

## 2014-12-31 MED ORDER — OXYTOCIN 40 UNITS IN LACTATED RINGERS INFUSION - SIMPLE MED
INTRAVENOUS | Status: AC
  Administered 2014-12-31: 62.5 mL/h via INTRAVENOUS
  Filled 2014-12-31: qty 1000

## 2014-12-31 MED ORDER — OXYCODONE-ACETAMINOPHEN 5-325 MG PO TABS: 2.0000 | ORAL_TABLET | ORAL | Status: DC | PRN

## 2014-12-31 MED ORDER — BENZOCAINE-MENTHOL 20-0.5 % EX AERO: 1.0000 "application " | INHALATION_SPRAY | CUTANEOUS | Status: DC | PRN

## 2014-12-31 MED ORDER — DIBUCAINE 1 % RE OINT: 1.0000 "application " | TOPICAL_OINTMENT | RECTAL | Status: DC | PRN

## 2014-12-31 MED ORDER — PRENATAL PLUS 27-1 MG PO TABS
ORAL_TABLET | ORAL | Status: AC
  Administered 2014-12-31: 1 via ORAL
  Filled 2014-12-31: qty 1

## 2014-12-31 MED ORDER — LANOLIN HYDROUS EX OINT: TOPICAL_OINTMENT | CUTANEOUS | Status: DC | PRN

## 2014-12-31 MED ORDER — ACETAMINOPHEN 325 MG PO TABS: 650.0000 mg | ORAL_TABLET | ORAL | Status: DC | PRN

## 2014-12-31 MED ORDER — SODIUM CHLORIDE 0.9 % IJ SOLN: 3.0000 mL | INTRAMUSCULAR | Status: DC | PRN

## 2014-12-31 NOTE — Anesthesia Postprocedure Evaluation (Signed)
  Anesthesia Post-op Note  Patient: Carolyn [name]   Procedure(s) Performed: * No procedures listed *  Anesthesia type:No value filed.  Patient location: PACU  Post pain: Pain level controlled  Post assessment: Post-op Vital signs reviewed, Patient's Cardiovascular Status Stable, Respiratory Function Stable, Patent Airway and No signs of Nausea or vomiting  Post vital signs: Reviewed and stable  Last Vitals:  Filed Vitals:   12/31/14 0935  BP: 122/84  Pulse: 78  Temp: 36.7 C  Resp: 16    Level of consciousness: awake, alert  and patient cooperative  Complications: No apparent anesthesia complications

## 2014-12-31 NOTE — Plan of Care (Signed)
Father of baby discussed with Clinical research associate how to go about obtaining a paternity test.  Very upset.  States his family does not believe the baby is his and he would like to get testing done ASAP so that he can prove to them it is his baby.  Discussed his concerns with nursery staff and then notified him that to have testing done he needs to go to [facility] and pay out of pocket to get the process started.  All questions answered and discussed concerns.

## 2014-12-31 NOTE — Progress Notes (Signed)
Admit Date: 12/29/2014 Today's Date: 12/31/2014  Post Partum Day 1  Subjective:  no complaints, tolerating PO and min SE to Magnesium.  BP normal.  Good UOP (125-150/hr).  Objective: Temp:  [97.9 F (36.6 C)-98.8 F (37.1 C)] 97.9 F (36.6 C) (06/19 0724) Pulse Rate:  [61-104] 61 (06/19 0735) Resp:  [16-20] 16 (06/19 0835) BP: (105-144)/(68-113) 105/74 mmHg (06/19 0735) SpO2:  [97 %-100 %] 100 % (06/19 0835)  Physical Exam:  General: alert and cooperative Lochia: appropriate Uterine Fundus: firm Incision: none DVT Evaluation: No evidence of DVT seen on physical exam. No edema.  DTRs 1+   Recent Labs  12/29/14 2330 12/31/14 0525  HGB 12.4 10.2*  HCT 38.0 31.3*    Assessment/Plan: Bottle Feeding No S/Sx magnesium toxicity or wprsening preclampsia.  Plan to stop Mag later today.   LOS: 1 day   Darica Goren [facility] 12/31/2014, 9:45 AM

## 2014-12-31 NOTE — Transfer of Care (Signed)
Immediate Anesthesia Transfer of Care Note  Patient: Carolyn [name]  Procedure(s) Performed: * No procedures listed *  Patient Location: PACU and Women's Unit  Anesthesia Type:Epidural  Level of Consciousness: awake and alert   Airway & Oxygen Therapy: Patient Spontanous Breathing  Post-op Assessment: Post -op Vital signs reviewed and stable  Post vital signs: Reviewed and stable  Last Vitals:  Filed Vitals:   12/31/14 0935  BP: 122/84  Pulse: 78  Temp: 36.7 C  Resp: 16    Complications: No apparent anesthesia complications

## 2015-01-01 DIAGNOSIS — Z8759 Personal history of other complications of pregnancy, childbirth and the puerperium: Secondary | ICD-10-CM

## 2015-01-01 DIAGNOSIS — O1414 Severe pre-eclampsia complicating childbirth: Secondary | ICD-10-CM

## 2015-01-01 MED ORDER — IBUPROFEN 600 MG PO TABS: 600.0000 mg | ORAL_TABLET | Freq: Four times a day (QID) | ORAL | Status: DC | PRN

## 2015-01-01 NOTE — Discharge Instructions (Signed)

## 2015-01-23 ENCOUNTER — Emergency Department
Admission: EM | Admit: 2015-01-23 | Discharge: 2015-01-23 | Disposition: A | Discharge status: Home / Self Care | Payer: 59 | Attending: Emergency Medicine | Admitting: Emergency Medicine

## 2015-01-23 ENCOUNTER — Emergency Department: Payer: 59

## 2015-01-23 ENCOUNTER — Encounter: Payer: Self-pay | Admitting: Radiology

## 2015-01-23 ENCOUNTER — Other Ambulatory Visit: Payer: Self-pay

## 2015-01-23 DIAGNOSIS — R0781 Pleurodynia: Secondary | ICD-10-CM | POA: Insufficient documentation

## 2015-01-23 DIAGNOSIS — O9953 Diseases of the respiratory system complicating the puerperium: Secondary | ICD-10-CM | POA: Insufficient documentation

## 2015-01-23 DIAGNOSIS — M549 Dorsalgia, unspecified: Secondary | ICD-10-CM | POA: Diagnosis not present

## 2015-01-23 DIAGNOSIS — J189 Pneumonia, unspecified organism: Secondary | ICD-10-CM

## 2015-01-23 DIAGNOSIS — J159 Unspecified bacterial pneumonia: Secondary | ICD-10-CM | POA: Insufficient documentation

## 2015-01-23 DIAGNOSIS — O9089 Other complications of the puerperium, not elsewhere classified: Secondary | ICD-10-CM | POA: Diagnosis not present

## 2015-01-23 LAB — CBC
HCT: 37.3 % (ref 35.0–47.0)
HEMOGLOBIN: 12.2 g/dL (ref 12.0–16.0)
MCH: 28.6 pg (ref 26.0–34.0)
MCHC: 32.6 g/dL (ref 32.0–36.0)
MCV: 87.7 fL (ref 80.0–100.0)
Platelets: 332 10*3/uL (ref 150–440)
RBC: 4.25 MIL/uL (ref 3.80–5.20)
RDW: 18 % — AB (ref 11.5–14.5)
WBC: 5.6 10*3/uL (ref 3.6–11.0)

## 2015-01-23 LAB — FIBRIN DERIVATIVES D-DIMER (ARMC ONLY): FIBRIN DERIVATIVES D-DIMER (ARMC): 1045 — AB (ref 0–499)

## 2015-01-23 LAB — BASIC METABOLIC PANEL
Anion gap: 10 (ref 5–15)
BUN: 5 mg/dL — AB (ref 6–20)
CO2: 24 mmol/L (ref 22–32)
CREATININE: 0.55 mg/dL (ref 0.44–1.00)
Calcium: 8.6 mg/dL — ABNORMAL LOW (ref 8.9–10.3)
Chloride: 106 mmol/L (ref 101–111)
Glucose, Bld: 93 mg/dL (ref 65–99)
Potassium: 3.6 mmol/L (ref 3.5–5.1)
Sodium: 140 mmol/L (ref 135–145)

## 2015-01-23 LAB — TROPONIN I

## 2015-01-23 MED ORDER — IOHEXOL 350 MG/ML SOLN
75.0000 mL | Freq: Once | INTRAVENOUS | Status: AC | PRN
  Administered 2015-01-23: 75 mL via INTRAVENOUS

## 2015-01-23 MED ORDER — HYDROCOD POLST-CPM POLST ER 10-8 MG/5ML PO SUER: 5.0000 mL | Freq: Two times a day (BID) | ORAL | Status: DC

## 2015-01-23 MED ORDER — LEVOFLOXACIN 750 MG PO TABS
750.0000 mg | ORAL_TABLET | Freq: Once | ORAL | Status: AC
  Administered 2015-01-23: 750 mg via ORAL
  Filled 2015-01-23: qty 1

## 2015-01-23 MED ORDER — HYDROCOD POLST-CPM POLST ER 10-8 MG/5ML PO SUER: 5.0000 mL | Freq: Once | ORAL | Status: DC

## 2015-01-23 MED ORDER — KETOROLAC TROMETHAMINE 30 MG/ML IJ SOLN
30.0000 mg | Freq: Once | INTRAMUSCULAR | Status: AC
  Administered 2015-01-23: 30 mg via INTRAVENOUS
  Filled 2015-01-23: qty 1

## 2015-01-23 MED ORDER — LEVOFLOXACIN 750 MG PO TABS: 750.0000 mg | ORAL_TABLET | Freq: Every day | ORAL | Status: DC

## 2015-01-23 NOTE — ED Notes (Signed)
Patient to ED with c/o right upper back pain, points to rib area. Reports pain is worse when she inhales and has been there about a week. Patient reports giving birth to baby about 3 weeks ago.

## 2015-01-23 NOTE — ED Provider Notes (Signed)
[facility] Emergency Department Provider Note     Time seen: ----------------------------------------- 11:31 AM on 01/23/2015 -----------------------------------------    I have reviewed the triage vital signs and the nursing notes.   HISTORY  Chief Complaint Back Pain    HPI Carolyn [name] is a 19 y.o. female who presents ER for right upper back pain, points to the rib area as well and the front. Patient reports is worse when she inhales has been there about a week. She states she woke up acutely in the middle of night with the pain. Sharp, nothing makes it better other than holding her breath. Patient states she gave birth about 3 weeks ago.   Past Medical History  Diagnosis Date  . Allergy   . Depression     Patient Active Problem List   Diagnosis Date Noted  . Hypertension in pregnancy, preeclampsia, severe, delivered 01/01/2015  . Postpartum care following vaginal delivery 12/31/2014  . Labor and delivery, indication for care 12/30/2014  . Abdominal pain 12/29/2014  . Labor and delivery indication for care or intervention 12/29/2014  . Braxton Hicks contractions 12/05/2014    No past surgical history on file.  Allergies Advair diskus; Sulfa antibiotics; Prednisone; and Banana  Social History History  Substance Use Topics  . Smoking status: Never Smoker   . Smokeless tobacco: Not on file  . Alcohol Use: No    Review of Systems Constitutional: Negative for fever. Eyes: Negative for visual changes. ENT: Negative for sore throat. Cardiovascular: Positive for right lower chest pain Respiratory: Positive shortness of breath Gastrointestinal: Negative for abdominal pain, vomiting and diarrhea. Genitourinary: Negative for dysuria. Musculoskeletal: Positive for right-sided mid back pain Skin: Negative for rash. Neurological: Negative for headaches, focal weakness or numbness.  10-point ROS otherwise  negative.  ____________________________________________   PHYSICAL EXAM:  VITAL SIGNS: ED Triage Vitals  Enc Vitals Group     BP 01/23/15 1049 126/95 mmHg     Pulse Rate 01/23/15 1049 90     Resp 01/23/15 1049 18     Temp 01/23/15 1049 98 F (36.7 C)     Temp Source 01/23/15 1049 Oral     SpO2 01/23/15 1049 10 %     Weight 01/23/15 1049 115 lb (52.164 kg)     Height 01/23/15 1049  (1.499 m)     Head Cir --      Peak Flow --      Pain Score 01/23/15 1127 9     Pain Loc --      Pain Edu? --      Excl. in GC? --     Constitutional: Alert and oriented. Well appearing and in no distress. Eyes: Conjunctivae are normal. PERRL. Normal extraocular movements. ENT   Head: Normocephalic and atraumatic.   Nose: No congestion/rhinnorhea.   Mouth/Throat: Mucous membranes are moist.   Neck: No stridor. Hematological/Lymphatic/Immunilogical: No cervical lymphadenopathy. Cardiovascular: Normal rate, regular rhythm. Normal and symmetric distal pulses are present in all extremities. No murmurs, rubs, or gallops. Respiratory: Normal respiratory effort without tachypnea nor retractions. Breath sounds are clear and equal bilaterally. No wheezes/rales/rhonchi. Gastrointestinal: Soft and nontender. No distention. No abdominal bruits. There is no CVA tenderness. Musculoskeletal: Nontender with normal range of motion in all extremities. No joint effusions.  No lower extremity tenderness nor edema. Neurologic:  Normal speech and language. No gross focal neurologic deficits are appreciated. Speech is normal. No gait instability. Skin:  Skin is warm, dry and intact. No rash  noted. Psychiatric: Mood and affect are normal. Speech and behavior are normal. Patient exhibits appropriate insight and judgment. ____________________________________________  EKG: Interpreted by me. Normal sinus rhythm, normal EKG with a rate of 84 bpm, normal axis normal intervals, no evidence of hypertrophy or  acute infarction.  ____________________________________________  ED COURSE:  Pertinent labs & imaging results that were available during my care of the patient were reviewed by me and considered in my medical decision making (see chart for details). Patiently cardiac labs, d-dimer and chest x-ray. ____________________________________________    LABS (pertinent positives/negatives)  Labs Reviewed  BASIC METABOLIC PANEL - Abnormal; Notable for the following:    BUN 5 (*)    Calcium 8.6 (*)    All other components within normal limits  CBC - Abnormal; Notable for the following:    RDW 18.0 (*)    All other components within normal limits  FIBRIN DERIVATIVES D-DIMER ([facility] ONLY) - Abnormal; Notable for the following:    Fibrin derivatives D-dimer ([facility]) 1045 (*)    All other components within normal limits  TROPONIN I    RADIOLOGY Images were viewed by me  Chest x-ray Right lower lobe pneumonia CT scan confirms same, no PE ____________________________________________  FINAL ASSESSMENT AND PLAN  Pleuritic pain and pneumonia  Plan: Patient was labs and imaging as dictated above. Remarkably the patient has no symptoms of pneumonia. She has been given Levaquin, started on Tussionex for pain. She'll be positive follow-up with her doctor 2-3 days for reevaluation.   Emily FilbertWilliams, Trejon Duford E, MD   Emily FilbertJonathan E Ayomide Purdy, MD 01/23/15 [phone]

## 2015-02-02 ENCOUNTER — Emergency Department: Payer: 59

## 2015-02-02 ENCOUNTER — Encounter: Payer: Self-pay | Admitting: *Deleted

## 2015-02-02 ENCOUNTER — Emergency Department
Admission: EM | Admit: 2015-02-02 | Discharge: 2015-02-02 | Discharge status: Against Medical Advice | Payer: 59 | Attending: Emergency Medicine | Admitting: Emergency Medicine

## 2015-02-02 ENCOUNTER — Encounter: Payer: Self-pay | Admitting: Emergency Medicine

## 2015-02-02 ENCOUNTER — Emergency Department
Admission: EM | Admit: 2015-02-02 | Discharge: 2015-02-02 | Disposition: A | Discharge status: Home / Self Care | Payer: 59 | Attending: Emergency Medicine | Admitting: Emergency Medicine

## 2015-02-02 DIAGNOSIS — O9089 Other complications of the puerperium, not elsewhere classified: Secondary | ICD-10-CM | POA: Diagnosis present

## 2015-02-02 DIAGNOSIS — Z3202 Encounter for pregnancy test, result negative: Secondary | ICD-10-CM | POA: Diagnosis not present

## 2015-02-02 DIAGNOSIS — Z79899 Other long term (current) drug therapy: Secondary | ICD-10-CM | POA: Insufficient documentation

## 2015-02-02 DIAGNOSIS — O9953 Diseases of the respiratory system complicating the puerperium: Secondary | ICD-10-CM | POA: Diagnosis not present

## 2015-02-02 DIAGNOSIS — J159 Unspecified bacterial pneumonia: Secondary | ICD-10-CM | POA: Diagnosis not present

## 2015-02-02 DIAGNOSIS — J869 Pyothorax without fistula: Secondary | ICD-10-CM | POA: Insufficient documentation

## 2015-02-02 DIAGNOSIS — Z792 Long term (current) use of antibiotics: Secondary | ICD-10-CM | POA: Insufficient documentation

## 2015-02-02 DIAGNOSIS — M549 Dorsalgia, unspecified: Secondary | ICD-10-CM | POA: Diagnosis present

## 2015-02-02 DIAGNOSIS — J852 Abscess of lung without pneumonia: Secondary | ICD-10-CM

## 2015-02-02 DIAGNOSIS — J189 Pneumonia, unspecified organism: Secondary | ICD-10-CM

## 2015-02-02 HISTORY — DX: Pneumonia, unspecified organism: J18.9

## 2015-02-02 LAB — COMPREHENSIVE METABOLIC PANEL
ALK PHOS: 88 U/L (ref 38–126)
ALT: 15 U/L (ref 14–54)
AST: 24 U/L (ref 15–41)
Albumin: 3.8 g/dL (ref 3.5–5.0)
Anion gap: 9 (ref 5–15)
BILIRUBIN TOTAL: 0.4 mg/dL (ref 0.3–1.2)
BUN: 5 mg/dL — AB (ref 6–20)
CO2: 26 mmol/L (ref 22–32)
Calcium: 8.7 mg/dL — ABNORMAL LOW (ref 8.9–10.3)
Chloride: 101 mmol/L (ref 101–111)
Creatinine, Ser: 0.58 mg/dL (ref 0.44–1.00)
GFR calc Af Amer: >60 mL/min (ref >60)
GFR calc non Af Amer: >60 mL/min (ref >60)
Glucose, Bld: 96 mg/dL (ref 65–99)
Potassium: 3.2 mmol/L — ABNORMAL LOW (ref 3.5–5.1)
Sodium: 136 mmol/L (ref 135–145)
Total Protein: 7.7 g/dL (ref 6.5–8.1)

## 2015-02-02 LAB — CBC WITH DIFFERENTIAL/PLATELET
Basophils Absolute: 0 10*3/uL (ref 0–0.1)
Basophils Relative: 1 %
EOS PCT: 8 %
Eosinophils Absolute: 0.6 10*3/uL (ref 0–0.7)
HCT: 36.7 % (ref 35.0–47.0)
Hemoglobin: 12 g/dL (ref 12.0–16.0)
LYMPHS PCT: 24 %
Lymphs Abs: 2 10*3/uL (ref 1.0–3.6)
MCH: 27.7 pg (ref 26.0–34.0)
MCHC: 32.7 g/dL (ref 32.0–36.0)
MCV: 84.9 fL (ref 80.0–100.0)
MONOS PCT: 8 %
Monocytes Absolute: 0.7 10*3/uL (ref 0.2–0.9)
Neutro Abs: 5 10*3/uL (ref 1.4–6.5)
Neutrophils Relative %: 59 %
PLATELETS: 463 10*3/uL — AB (ref 150–440)
RBC: 4.33 MIL/uL (ref 3.80–5.20)
RDW: 17.4 % — AB (ref 11.5–14.5)
WBC: 8.3 10*3/uL (ref 3.6–11.0)

## 2015-02-02 LAB — FIBRIN DERIVATIVES D-DIMER (ARMC ONLY): Fibrin derivatives D-dimer (ARMC): 2262 — ABNORMAL HIGH (ref 0–499)

## 2015-02-02 LAB — POCT PREGNANCY, URINE: Preg Test, Ur: NEGATIVE

## 2015-02-02 MED ORDER — KETOROLAC TROMETHAMINE 30 MG/ML IJ SOLN
30.0000 mg | Freq: Once | INTRAMUSCULAR | Status: AC
  Administered 2015-02-02: 30 mg via INTRAVENOUS
  Filled 2015-02-02: qty 1

## 2015-02-02 MED ORDER — KETOROLAC TROMETHAMINE 10 MG PO TABS: 10.0000 mg | ORAL_TABLET | Freq: Three times a day (TID) | ORAL | Status: DC

## 2015-02-02 MED ORDER — HYDROCODONE-ACETAMINOPHEN 5-325 MG PO TABS: 1.0000 | ORAL_TABLET | ORAL | Status: DC | PRN

## 2015-02-02 MED ORDER — DOXYCYCLINE HYCLATE 100 MG PO TABS: 100.0000 mg | ORAL_TABLET | Freq: Two times a day (BID) | ORAL | Status: DC

## 2015-02-02 MED ORDER — IOHEXOL 300 MG/ML  SOLN
75.0000 mL | Freq: Once | INTRAMUSCULAR | Status: DC | PRN
  Filled 2015-02-02: qty 75

## 2015-02-02 NOTE — ED Notes (Signed)
Patient transported to CT 

## 2015-02-02 NOTE — ED Provider Notes (Signed)
[facility] Emergency Department Provider Note ____________________________________________  Time seen: 1540  I have reviewed the triage vital signs and the nursing notes.  HISTORY  Chief Complaint  Back Pain  HPI Carolyn [name] is a 19 y.o. female returns to the ED for continued complaints of right mid back pain, despite being treated for a pneumonia 10 days prior. She reported to the ED on July 12, with complaints of the same upper back pain, of about a week duration. She was treated at that time following abnormal chest x-ray and a normal CT for an acute pneumonia with Levaquin. She was also given some codeine cough syrup for her pain. Of note was that she had no symptoms consistent with acute acquired pneumonia at the time denying any cough, congestion, shortness of breath, fever, chills, or sweats. She has completed the Levaquin, and reports that she still is without any upper respiratory symptoms, and has been without any interim  fevers, chills, sweats, or shortness of breath. She is here today primarily requesting pain medicine for this back pain that she believes to be musculoskeletal in nature.  Past Medical History  Diagnosis Date  . Allergy   . Depression   . Pneumonia     Patient Active Problem List   Diagnosis Date Noted  . Hypertension in pregnancy, preeclampsia, severe, delivered 01/01/2015  . Postpartum care following vaginal delivery 12/31/2014  . Labor and delivery, indication for care 12/30/2014  . Abdominal pain 12/29/2014  . Labor and delivery indication for care or intervention 12/29/2014  . Braxton Hicks contractions 12/05/2014   History reviewed. No pertinent past surgical history.  Current Outpatient Rx  Name  Route  Sig  Dispense  Refill  . chlorpheniramine-HYDROcodone (TUSSIONEX PENNKINETIC ER) 10-8 MG/5ML SUER   Oral   Take 5 mLs by mouth 2 (two) times daily.   140 mL   0   . levofloxacin (LEVAQUIN) 750 MG tablet   Oral  Take 1 tablet (750 mg total) by mouth daily.   5 tablet   0   . doxycycline (VIBRA-TABS) 100 MG tablet   Oral   Take 1 tablet (100 mg total) by mouth 2 (two) times daily.   20 tablet   0   . HYDROcodone-acetaminophen (NORCO) 5-325 MG per tablet   Oral   Take 1 tablet by mouth every 4 (four) hours as needed for moderate pain.   6 tablet   0   . ibuprofen (ADVIL,MOTRIN) 200 MG tablet   Oral   Take 400 mg by mouth every 6 (six) hours as needed.         Marland Kitchen PRENATAL 28-0.8 MG TABS   Oral   Take 1 tablet by mouth daily.           Allergies Advair diskus; Sulfa antibiotics; Prednisone; and Banana  Family History  Problem Relation Age of Onset  . Hypertension Father    Social History History  Substance Use Topics  . Smoking status: Never Smoker   . Smokeless tobacco: Not on file  . Alcohol Use: No   Review of Systems  Constitutional: Negative for fever. Eyes: Negative for visual changes. ENT: Negative for sore throat. Cardiovascular: Negative for chest pain. Respiratory: Negative for shortness of breath, cough, congestion. Gastrointestinal: Negative for abdominal pain, vomiting and diarrhea. Genitourinary: Negative for dysuria. Musculoskeletal: Positive for back pain. Skin: Negative for rash. Neurological: Negative for headaches, focal weakness or numbness. ____________________________________________  PHYSICAL EXAM:  VITAL SIGNS: ED Triage Vitals  Enc Vitals Group     BP 02/02/15 1448 113/87 mmHg     Pulse Rate 02/02/15 1448 84     Resp 02/02/15 1448 20     Temp 02/02/15 1448 98.5 F (36.9 C)     Temp Source 02/02/15 1448 Oral     SpO2 02/02/15 1448 100 %     Weight 02/02/15 1448 115 lb (52.164 kg)     Height 02/02/15 1448 4\' 11"  (1.499 m)     Head Cir --      Peak Flow --      Pain Score 02/02/15 1449 8     Pain Loc --      Pain Edu? --      Excl. in GC? --    Constitutional: Alert and oriented. Well appearing and in no distress. Eyes:  Conjunctivae are normal. PERRL. Normal extraocular movements. ENT   Head: Normocephalic and atraumatic.   Nose: No congestion/rhinnorhea.   Mouth/Throat: Mucous membranes are moist.   Neck: Supple. No thyromegaly. Hematological/Lymphatic/Immunilogical: No cervical lymphadenopathy. Cardiovascular: Normal rate, regular rhythm.  Respiratory: Normal respiratory effort. No wheezes/rales/rhonchi. Gastrointestinal: Soft and nontender. No distention. Musculoskeletal: Tenderness to palpation over the right scapulothoracic region between the lower pole of the blade and the spine.  Nontender with normal range of motion in all extremities.  Neurologic:  Normal gait without ataxia. Normal speech and language. No gross focal neurologic deficits are appreciated. Skin:  Skin is warm, dry and intact. No rash noted. Psychiatric: Mood and affect are normal. Patient exhibits appropriate insight and judgment. ____________________________________________   LABS Labs Reviewed  CBC WITH DIFFERENTIAL/PLATELET - Abnormal; Notable for the following:    RDW 17.4 (*)    Platelets 463 (*)    All other components within normal limits  COMPREHENSIVE METABOLIC PANEL - Abnormal; Notable for the following:    Potassium 3.2 (*)    BUN 5 (*)    Calcium 8.7 (*)    All other components within normal limits  FIBRIN DERIVATIVES D-DIMER ([facility] ONLY) - Abnormal; Notable for the following:    Fibrin derivatives D-dimer [facility]) 2262 (*)    All other components within normal limits  CULTURE, BLOOD (ROUTINE X 2)  CULTURE, BLOOD (ROUTINE X 2)  POC URINE PREG, ED  POCT PREGNANCY, URINE  ________________________________________________  RADIOLOGY CXR IMPRESSION: Persistent abnormal airspace opacity that appears pleural-based posteriorly in the right lower lobe. Given the lack of significant change following antibiotic therapy, repeat chest CT scanning is recommended to assess the patient for possible development  of an empyema or lung abscess. Neoplasm is felt less likely given the patient's age.  I, Khristin Keleher, Charlesetta Ivory, personally viewed and evaluated these images as part of my medical decision making.  ____________________________________________  PROCEDURES  Toradol 30 mg IVP  CT Chest w/ contrast - canceled ____________________________________________  INITIAL IMPRESSION / ASSESSMENT AND PLAN / ED COURSE  Patient notified of chest x-ray results, and need for repeat chest CT.  She reports she is unable to stay for further evaluation and management, due to child care issues.  She is made aware of the serious nature of her pleuritic chest pain and the concern for probable lung abscess. She is also made aware of the risks of leaving the ED against the advice of this provider given the increased risk of increasing lung infection, sepsis, and death. She verbalized understanding of the risks of delayed treatment, and requests discharge. She has signed the [facility] form, been given discharge instructions, and  prescription Doxycycline and Norco #6. She agrees to return for treatment tomorrow.  Discussed case and AMA disposition with Dr. Fanny Bien. ____________________________________________  FINAL CLINICAL IMPRESSION(S) / ED DIAGNOSES  Final diagnoses:  Empyema of right pleural space  Lung abscess      Lissa Hoard, PA-C 02/02/15 1949  Charlesetta Ivory [place], PA-C 02/02/15 1951  Sharyn Creamer, MD 02/02/15 2311

## 2015-02-02 NOTE — Consult Note (Signed)
Date: 02/02/2015,   MRN# [id number] PERL FOLMAR 1995/08/15 Code Status:  Hosp day:@LENGTHOFSTAYDAYS @ Referring MD: @ATDPROV @     PCP:      AdmissionWeight: 115 lb (52.164 kg)                 CurrentWeight: 115 lb (52.164 kg) Maziyah KIMBERY HARWOOD is a 19 y.o. old female seen in consultation for pneumonia  at the request of ER.     CHIEF COMPLAINT:   Rt flank back pain   HISTORY OF PRESENT ILLNESS   19 yo white female seen in consultation for abnormal CT chest, patient has dense RLL consolidation noted on 7/12 patient was recently diagnosed with pneumonia and treated on Levaquin 10 days ago, she is Post partum 1 month  Patient had some Rt flank pain that has been bothering her after she completed abx, Ct chest shows resolving RLL density with pneumatocele with probable rounded atelectasis.  She has no fevers, chills, NVD. Patient has NO signs of Sepsis and has NO signs of failure of outpatient therapy.  She is some mild  improvement in her symptom of back pain, and on imaging has ongoing consolidation.  she has no cough, fever, or elevated white count however imaging does appear consistent with a lobar pneumonia.  She denies SOB,WOB and chest pain  She is not breast-feeding. She is allergic to sulfa meds only.    PAST MEDICAL HISTORY   Past Medical History  Diagnosis Date  . Allergy   . Depression   . Pneumonia      SURGICAL HISTORY   History reviewed. No pertinent past surgical history.   FAMILY HISTORY   Family History  Problem Relation Age of Onset  . Hypertension Father      SOCIAL HISTORY   History  Substance Use Topics  . Smoking status: Never Smoker   . Smokeless tobacco: Not on file  . Alcohol Use: No     MEDICATIONS    Home Medication:  Current Outpatient Rx  Name  Route  Sig  Dispense  Refill  . chlorpheniramine-HYDROcodone (TUSSIONEX PENNKINETIC ER) 10-8 MG/5ML SUER   Oral   Take 5 mLs by mouth 2 (two) times daily.   140 mL   0   .  doxycycline (VIBRA-TABS) 100 MG tablet   Oral   Take 1 tablet (100 mg total) by mouth 2 (two) times daily.   20 tablet   0   . HYDROcodone-acetaminophen (NORCO) 5-325 MG per tablet   Oral   Take 1 tablet by mouth every 4 (four) hours as needed for moderate pain.   6 tablet   0   . ibuprofen (ADVIL,MOTRIN) 200 MG tablet   Oral   Take 400 mg by mouth every 6 (six) hours as needed.         Marland Kitchen levofloxacin (LEVAQUIN) 750 MG tablet   Oral   Take 1 tablet (750 mg total) by mouth daily.   5 tablet   0   . PRENATAL 28-0.8 MG TABS   Oral   Take 1 tablet by mouth daily.            Current Medication:  Current facility-administered medications:  .  iohexol (OMNIPAQUE) 300 MG/ML solution 75 mL, 75 mL, Intravenous, Once PRN, Medication Radiologist, MD  Current outpatient prescriptions:  .  chlorpheniramine-HYDROcodone (TUSSIONEX PENNKINETIC ER) 10-8 MG/5ML SUER, Take 5 mLs by mouth 2 (two) times daily., Disp: 140 mL, Rfl: 0 .  doxycycline (VIBRA-TABS) 100 MG tablet,  Take 1 tablet (100 mg total) by mouth 2 (two) times daily., Disp: 20 tablet, Rfl: 0 .  HYDROcodone-acetaminophen (NORCO) 5-325 MG per tablet, Take 1 tablet by mouth every 4 (four) hours as needed for moderate pain., Disp: 6 tablet, Rfl: 0 .  ibuprofen (ADVIL,MOTRIN) 200 MG tablet, Take 400 mg by mouth every 6 (six) hours as needed., Disp: , Rfl:  .  levofloxacin (LEVAQUIN) 750 MG tablet, Take 1 tablet (750 mg total) by mouth daily., Disp: 5 tablet, Rfl: 0 .  PRENATAL 28-0.8 MG TABS, Take 1 tablet by mouth daily. , Disp: , Rfl:     ALLERGIES   Advair diskus; Sulfa antibiotics; Prednisone; and Banana     REVIEW OF SYSTEMS   Review of Systems  Constitutional: Negative for fever, chills, weight loss, malaise/fatigue and diaphoresis.  HENT: Negative for hearing loss and tinnitus.   Eyes: Negative for blurred vision, double vision and photophobia.  Respiratory: Negative for cough, hemoptysis, sputum production,  shortness of breath and wheezing.   Cardiovascular: Negative for chest pain, palpitations and orthopnea.  Gastrointestinal: Negative for heartburn, nausea and vomiting.  Genitourinary: Negative for dysuria, urgency and frequency.  Musculoskeletal: Positive for back pain.  Skin: Negative for itching and rash.  Neurological: Negative for dizziness, tingling, tremors and headaches.  Endo/Heme/Allergies: Negative for environmental allergies. Does not bruise/bleed easily.  Psychiatric/Behavioral: Negative for depression, suicidal ideas and substance abuse.     VS: BP 108/79 mmHg  Pulse 85  Temp(Src) 98.5 F (36.9 C) (Oral)  Resp 78  Ht 4\' 11"  (1.499 m)  Wt 115 lb (52.164 kg)  BMI 23.21 kg/m2  SpO2 100%  LMP   Breastfeeding? No     PHYSICAL EXAM  Physical Exam  Constitutional: She is oriented to person, place, and time. She appears well-developed and well-nourished. No distress.  HENT:  Head: Normocephalic and atraumatic.  Mouth/Throat: No oropharyngeal exudate.  Eyes: EOM are normal. Pupils are equal, round, and reactive to light. No scleral icterus.  Neck: Normal range of motion. Neck supple.  Cardiovascular: Normal rate, regular rhythm and normal heart sounds.   No murmur heard. Pulmonary/Chest: No stridor. No respiratory distress. She has no wheezes. She has no rales. She exhibits no tenderness.  Abdominal: Soft. Bowel sounds are normal. She exhibits no distension. There is no tenderness. There is no rebound.  Musculoskeletal: Normal range of motion. She exhibits no edema.  No RT flank pain, no Costavertebral tenderness  Neurological: She is alert and oriented to person, place, and time. She displays normal reflexes. Coordination normal.  Skin: Skin is warm. She is not diaphoretic.  Psychiatric: She has a normal mood and affect.        LABS    Recent Labs     02/02/15  1720  HGB  12.0  HCT  36.7  MCV  84.9  WBC  8.3  BUN  5*  CREATININE  0.58  GLUCOSE  96   CALCIUM  8.7*  ,    No results for input(s): PH in the last 72 hours.  Invalid input(s): PCO2, PO2, BASEEXCESS, BASEDEFICITE, TFT    CULTURE RESULTS   No results found for this or any previous visit (from the past 240 hour(s)).        IMAGING    Dg Chest 2 View  02/02/2015   CLINICAL DATA:  Persistent back pain posteriorly on the right despite completing antibiotic regimen for recent diagnosis of pneumonia.  EXAM: CHEST  2 VIEW  COMPARISON:  Chest  x-ray and CT scan of the chest of January 23, 2015.  FINDINGS: There is persistent airspace opacity posteriorly in the right lower lobe. Subtle increased density more superiorly persists as well. The heart and pulmonary vascularity are normal. The trachea is midline. There is no pleural effusion. The bony thorax is unremarkable.  IMPRESSION: Persistent abnormal airspace opacity that appears pleural-based posteriorly in the right lower lobe. Given the lack of significant change following antibiotic therapy, repeat chest CT scanning is recommended to assess the patient for possible development of an empyema or lung abscess. Neoplasm is felt less likely given the patient's age.   Electronically Signed   By: David  [place] M.D.   On: 02/02/2015 16:12   Ct Chest W Contrast  02/02/2015   CLINICAL DATA:  Re-evaluate pulmonary abscess  EXAM: CT CHEST WITH CONTRAST  TECHNIQUE: Multidetector CT imaging of the chest was performed during intravenous contrast administration.  CONTRAST:  75 cc Omnipaque 300 IV contrast  COMPARISON:  Chest CT 01/23/2015 and chest radiograph 02/02/2015 and 01/23/2015  FINDINGS: Mediastinum/Nodes: Thyroid appears grossly normal. Heart size is normal. No pericardial effusion. Mild prominence of right hilar lymph nodes, largest 0.7 cm image 27. Minimal prominence of triangular soft tissue within the anterior mediastinum, image 20, compatible with thymus in this young patient.  Lungs/Pleura: Patchy pleural-based right lower lobe  consolidation is identified with internal air bronchogram formation, slightly more confluent than previously. The dominant rounded right lower lobe pleural-based area of pulmonary parenchymal consolidation now measures 6.1 x 4.0 cm, not significantly changed allowing for differences in aeration. No new left lung opacity. Central airways are patent.  Upper abdomen: Normal  Musculoskeletal: No acute osseous abnormality.  IMPRESSION: Stable dominant dense right lower lobe masslike consolidation with internal air bronchogram formation, but no internal cavitation or other complicating feature. The constellation of findings is again suggestive of pneumonia which may have a relatively rounded configuration in this young patient. Followup to radiographic resolution with PA and lateral chest radiographs may be considered in 4-6 weeks after treatment.   Electronically Signed   By: Christiana Pellant M.D.   On: 02/02/2015 20:32           ASSESSMENT/PLAN   19 yo white female with CT chest c/w RLL consolidation with air bronchogram signs which have improved since last CT chest New findings c/w pneumatocele and probable early onset of rounded atelectasis which resembles post inflammatory process   At this point in time, patient is asymptomatic and has NOT shown evidence that she has failed outpatient pneumonia abx therapy She has no signs or symptoms of sepsis at this time  Patient states that her back pain has improved over last several days. I have advised to take motrin and tylenol as needed for next several days and follow up in [facility] in 1 week and will obtain Ct chest in 6-8 weeks  1.tylenol and motrin scheduled every 8 hrs for next several days and as needed afterwards 2.follow up in Pulmonary clinic Phone number [phone] given to patient to make appointment 3.follow up CT chest in 6-8 weeks 4.advised to come back to ER if she has clinical signs of sepsis,or  NVD.   Patient  understands and is satisfied with Consultation and wants to go home.         I have personally obtained a history, examined the patient, evaluated laboratory and independently reviewed imaging results, formulated the assessment and plan and placed orders.  The Patient requires high complexity  decision making for assessment and support, frequent evaluation and titration of therapies, application of advanced monitoring technologies and extensive interpretation of multiple databases. Time spent with patient 45 minutes.    Lucie Leather, M.D. Pulmonary & Critical Care Medicine [facility] Medical Director Intensive Care Unit

## 2015-02-02 NOTE — ED Provider Notes (Signed)
[facility] Emergency Department Provider Note  ____________________________________________  Time seen: Approximately 9:16 PM  I have reviewed the triage vital signs and the nursing notes.   HISTORY  Chief Complaint Back Pain    HPI Carolyn [name] is a 19 y.o. female who returns after being seen earlier today. See previous note for history of present illness. She returns for a CT of the chest. She initially presented on 01/23/2015 with pleuritic back pain, that had been present about one week. She had elevated d-dimer, pneumonia on chest x-ray and CT of the chest. No pulmonary embolism was seen. She was started on Levaquin. She returns today having completed the course of antibiotics and still complains of right mid pleuritic back pain. She has remained afebrile without fevers or chills, cough or congestion. She denies chest pain, headache. She is one month postpartum. She is not breast-feeding. She had hypertension, preeclampsia during pregnancy and she had a vaginal delivery. She had to leave earlier today to pick up her son. She returns now for further evaluation.     Past Medical History  Diagnosis Date  . Allergy   . Depression   . Pneumonia     Patient Active Problem List   Diagnosis Date Noted  . Hypertension in pregnancy, preeclampsia, severe, delivered 01/01/2015  . Postpartum care following vaginal delivery 12/31/2014  . Labor and delivery, indication for care 12/30/2014  . Abdominal pain 12/29/2014  . Labor and delivery indication for care or intervention 12/29/2014  . Braxton Hicks contractions 12/05/2014    History reviewed. No pertinent past surgical history.  Current Outpatient Rx  Name  Route  Sig  Dispense  Refill  . chlorpheniramine-HYDROcodone (TUSSIONEX PENNKINETIC ER) 10-8 MG/5ML SUER   Oral   Take 5 mLs by mouth 2 (two) times daily.   140 mL   0   . doxycycline (VIBRA-TABS) 100 MG tablet   Oral   Take 1 tablet (100 mg  total) by mouth 2 (two) times daily.   20 tablet   0   . HYDROcodone-acetaminophen (NORCO) 5-325 MG per tablet   Oral   Take 1 tablet by mouth every 4 (four) hours as needed for moderate pain.   6 tablet   0   . ibuprofen (ADVIL,MOTRIN) 200 MG tablet   Oral   Take 400 mg by mouth every 6 (six) hours as needed.         Marland Kitchen levofloxacin (LEVAQUIN) 750 MG tablet   Oral   Take 1 tablet (750 mg total) by mouth daily.   5 tablet   0   . PRENATAL 28-0.8 MG TABS   Oral   Take 1 tablet by mouth daily.            Allergies Advair diskus; Sulfa antibiotics; Prednisone; and Banana  Family History  Problem Relation Age of Onset  . Hypertension Father     Social History History  Substance Use Topics  . Smoking status: Never Smoker   . Smokeless tobacco: Not on file  . Alcohol Use: No    Review of Systems Constitutional: No fever/chills Eyes: No visual changes. ENT: No sore throat. Cardiovascular: Denies chest pain. Respiratory: Denies shortness of breath. Gastrointestinal: No abdominal pain.  No nausea, no vomiting.  No diarrhea.  No constipation. Genitourinary: Negative for dysuria. Musculoskeletal: Negative for back pain. Skin: Negative for rash. Neurological: Negative for headaches, focal weakness or numbness.  10-point ROS otherwise negative.  ____________________________________________   PHYSICAL EXAM:  VITAL  SIGNS: ED Triage Vitals  Enc Vitals Group     BP 02/02/15 1919 108/79 mmHg     Pulse Rate 02/02/15 1919 85     Resp 02/02/15 1919 78     Temp 02/02/15 1919 98.5 F (36.9 C)     Temp Source 02/02/15 1919 Oral     SpO2 02/02/15 1919 100 %     Weight 02/02/15 1919 115 lb (52.164 kg)     Height 02/02/15 1919 4\' 11"  (1.499 m)     Head Cir --      Peak Flow --      Pain Score 02/02/15 1920 7     Pain Loc --      Pain Edu? --      Excl. in GC? --     Constitutional: Alert and oriented. Well appearing and in no acute distress. Eyes:  Conjunctivae are normal.. EOMI. Head: Atraumatic. Nose: No congestion/rhinnorhea. Mouth/Throat: Mucous membranes are moist.  Oropharynx non-erythematous. Neck: supple. No cervical adenopathy Cardiovascular: Normal rate, regular rhythm. Grossly normal heart sounds.  Good peripheral circulation. Respiratory: Normal respiratory effort.  No retractions. Lungs CTAB. Gastrointestinal: Soft and nontender. No distention. No abdominal bruits. No CVA tenderness. Musculoskeletal: No lower extremity tenderness nor edema.  No joint effusions. Neurologic:  Normal speech and language. No gross focal neurologic deficits are appreciated. No gait instability. Skin:  Skin is warm, dry and intact. No rash noted. Psychiatric: Mood and affect are normal. Speech and behavior are normal.  ____________________________________________   LABS (all labs ordered are listed, but only abnormal results are displayed)  Labs Reviewed - No data to display ____________________________________________  EKG   ____________________________________________  RADIOLOGY  EXAM: CT CHEST WITH CONTRAST  TECHNIQUE: Multidetector CT imaging of the chest was performed during intravenous contrast administration.  CONTRAST: 75 cc Omnipaque 300 IV contrast  COMPARISON: Chest CT 01/23/2015 and chest radiograph 02/02/2015 and 01/23/2015  FINDINGS: Mediastinum/Nodes: Thyroid appears grossly normal. Heart size is normal. No pericardial effusion. Mild prominence of right hilar lymph nodes, largest 0.7 cm image 27. Minimal prominence of triangular soft tissue within the anterior mediastinum, image 20, compatible with thymus in this young patient.  Lungs/Pleura: Patchy pleural-based right lower lobe consolidation is identified with internal air bronchogram formation, slightly more confluent than previously. The dominant rounded right lower lobe pleural-based area of pulmonary parenchymal consolidation now measures 6.1  x 4.0 cm, not significantly changed allowing for differences in aeration. No new left lung opacity. Central airways are patent.  Upper abdomen: Normal  Musculoskeletal: No acute osseous abnormality.  IMPRESSION: Stable dominant dense right lower lobe masslike consolidation with internal air bronchogram formation, but no internal cavitation or other complicating feature. The constellation of findings is again suggestive of pneumonia which may have a relatively rounded configuration in this young patient. Followup to radiographic resolution with PA and lateral chest radiographs may be considered in 4-6 weeks after treatment.   Electronically Signed  By: Christiana Pellant M.D.  On: 02/02/2015 20:32  ____________________________________________   PROCEDURES  Procedure(s) performed: None  Critical Care performed: No  ____________________________________________   INITIAL IMPRESSION / ASSESSMENT AND PLAN / ED COURSE  Pertinent labs & imaging results that were available during my care of the patient were reviewed by me and considered in my medical decision making (see chart for details).  19 year old female, postpartum 1 month who returns to the emergency department for persistent right mid back pain. She has a abnormal CT revealing a persistent right lower lobe pneumonia without  cavitation. She was initially evaluated on 01/23/2015 and started levaquin.   Her back pain has not resolved. She has never had fevers, or chills. She denies any cough or congestion or shortness of breath. She was seen by the pulmonologist tonight in the emergency room and will follow-up outpatient in his office. Her situation was discussed with Dr. Sharion Settler and Dr. Sheryle Hail. She can treat pain with Tylenol or ibuprofen. Further antibiotics were not given tonight. ____________________________________________   FINAL CLINICAL IMPRESSION(S) / ED DIAGNOSES  Final diagnoses:  Community acquired  pneumonia      Ignacia Bayley, PA-C 02/02/15 2345  Loleta Rose, MD 02/03/15 [phone]

## 2015-02-02 NOTE — ED Notes (Signed)
Dx with pneu 2 weeks ago, has not followup still has back pain

## 2015-02-02 NOTE — ED Notes (Signed)
Having pain to right mid back. .recently dx'd with pneumonia .states she thinks that is cleared up but still having pain back. Denies recent injury

## 2015-02-02 NOTE — ED Notes (Signed)
Patient with complaint of right upper back pain times 4 weeks. Patient states that she was diagnosed with pneumonia 2 weeks ago and has finished the antibiotics. Patient states that she was seen here earlier today and was told that she had a pus pocket at her left lung but that she had to leave before she could have her CT scan. Patient denies fever.

## 2015-02-02 NOTE — Discharge Instructions (Signed)
Pleurisy Pleurisy is an inflammation and swelling of the lining of the lungs (pleura). Because of this inflammation, it hurts to breathe. It can be aggravated by coughing, laughing, or deep breathing. Pleurisy is often caused by an underlying infection or disease.  HOME CARE INSTRUCTIONS  Monitor your pleurisy for any changes. The following actions may help to alleviate any discomfort you are experiencing:  Medicine may help with pain. Only take over-the-counter or prescription medicines for pain, discomfort, or fever as directed by your health care provider.  Only take antibiotic medicine as directed. Make sure to finish it even if you start to feel better. SEEK MEDICAL CARE IF:   Your pain is not controlled with medicine or is increasing.  You have an increase in pus-like (purulent) secretions brought up with coughing. SEEK IMMEDIATE MEDICAL CARE IF:   You have blue or dark lips, fingernails, or toenails.  You are coughing up blood.  You have increased difficulty breathing.  You have continuing pain unrelieved by medicine or pain lasting more than 1 week.  You have pain that radiates into your neck, arms, or jaw.  You develop increased shortness of breath or wheezing.  You develop a fever, rash, vomiting, fainting, or other serious symptoms. MAKE SURE YOU:  Understand these instructions.   Will watch your condition.   Will get help right away if you are not doing well or get worse.  Document Released: 06/30/2005 Document Revised: 03/02/2013 Document Reviewed: 12/12/2012 [facility] Patient Information 2015 [place], [place]. This information is not intended to replace advice given to you by your health care provider. Make sure you discuss any questions you have with your health care provider.  Your labs and chest x-ray show you have a lung infection and possible abscess. It is necessary for you to be admitted for treatment of this serious infection. You were advised of the  risks associated with leaving the ED before further testing and CT scans could be performed.  You state that you are unable to stay due to family and childcare issues.  PLEASE RETURN TO THE ED AS SOON AS POSSIBLE FOR TREATMENT.

## 2015-02-02 NOTE — ED Notes (Signed)
Pt to ED c/o back pain,  Was seen in the ED earlier.

## 2015-02-02 NOTE — Discharge Instructions (Signed)
Pneumonia, Adult Pneumonia is an infection of the lungs. It may be caused by a germ (virus or bacteria). Some types of pneumonia can spread easily from person to person. This can happen when you cough or sneeze. HOME CARE  Only take medicine as told by your doctor.  Take your medicine (antibiotics) as told. Finish it even if you start to feel better.  Do not smoke.  You may use a vaporizer or humidifier in your room. This can help loosen thick spit (mucus).  Sleep so you are almost sitting up (semi-upright). This helps reduce coughing.  Rest. A shot (vaccine) can help prevent pneumonia. Shots are often advised for:  People over 55 years old.  Patients on chemotherapy.  People with long-term (chronic) lung problems.  People with immune system problems. GET HELP RIGHT AWAY IF:   You are getting worse.  You cannot control your cough, and you are losing sleep.  You cough up blood.  Your pain gets worse, even with medicine.  You have a fever.  Any of your problems are getting worse, not better.  You have shortness of breath or chest pain. MAKE SURE YOU:   Understand these instructions.  Will watch your condition.  Will get help right away if you are not doing well or get worse. Document Released: 12/17/2007 Document Revised: 09/22/2011 Document Reviewed: 09/20/2010 [facility] Patient Information 2015 [place], [place]. This information is not intended to replace advice given to you by your health care provider. Make sure you discuss any questions you have with your health care provider.   He may take ibuprofen or Tylenol for pain control. Contact the office of the pulmonologist, Dr. Belia Heman, next week to schedule a follow-up appointment. Return to the emergency room for any concerns.

## 2015-02-02 NOTE — ED Provider Notes (Addendum)
-----------------------------------------   8:58 PM on 02/02/2015 -----------------------------------------  Medical screening examination/treatment/procedure(s) were conducted as a shared visit with non-physician practitioner(s) and myself.  I personally evaluated the patient during the encounter.  In brief, patient was recently diagnosed with pneumonia and treated on Levaquin after giving birth. She is not had improvement in her symptom of back pain, and on imaging has ongoing consolidation. This is very interesting in that she has no cough, fever, or elevated white count however imaging does appear consistent with a lobar pneumonia.  She is not breast-feeding. She is allergic to sulfa meds only.  I saw and evaluated her personally and she is stable in no distress. Given the ongoing consolidation and concern for the exact etiology we will admit the patient for initiation of broad-spectrum antibiotic's for healthcare associated pneumonia possibility and obtain pulmonary consultation for further evaluation of this ongoing consolidation.  Discussed with the patient and also plan with PA Toomey.  Admit. Stable. Right middle lobe consolidation, failing outpatient therapy for pneumonia.  Sharyn Creamer, MD 02/02/15 2100  ----------------------------------------- 10:26 PM on 02/02/2015 -----------------------------------------  Dr. Belia Heman of pulmonary service advising that the patient may be able to be discharged home. As I have discussed this case with Dr. Sheryle Hail of internal medicine, we're both in agreement that pulmonary consultation in the ER would be necessary previous to any decision to discharge this patient home given the unclear etiology of today's CT findings which may or may not be related to a pneumonia. I have discussed with Dr. Belia Heman and he will come and see the patient in consultation in the ER. Anticipate discharge based on recommendations from pulmonary upon consultation. Of note the  patient is hemodynamic was stable without elevated white count without fever without cough or obvious symptoms of pneumonia. I am, along with the hospitalist, concerned that we do not have a clear etiology for this radiographic finding. Dr. Belia Heman will come perform consultation.  Plan of care as listed above assigned to Dr. Langston Masker and followed up by PA Claudell Kyle, MD 02/02/15 2228

## 2015-02-07 LAB — CULTURE, BLOOD (ROUTINE X 2)
CULTURE: NO GROWTH
Culture: NO GROWTH

## 2015-03-08 ENCOUNTER — Encounter: Payer: Self-pay | Admitting: Emergency Medicine

## 2015-03-08 ENCOUNTER — Emergency Department
Admission: EM | Admit: 2015-03-08 | Discharge: 2015-03-08 | Disposition: A | Discharge status: Home / Self Care | Payer: Medicaid Other | Attending: Emergency Medicine | Admitting: Emergency Medicine

## 2015-03-08 DIAGNOSIS — R102 Pelvic and perineal pain: Secondary | ICD-10-CM | POA: Diagnosis present

## 2015-03-08 DIAGNOSIS — Z79899 Other long term (current) drug therapy: Secondary | ICD-10-CM | POA: Diagnosis not present

## 2015-03-08 DIAGNOSIS — Z792 Long term (current) use of antibiotics: Secondary | ICD-10-CM | POA: Insufficient documentation

## 2015-03-08 DIAGNOSIS — N946 Dysmenorrhea, unspecified: Secondary | ICD-10-CM

## 2015-03-08 DIAGNOSIS — F419 Anxiety disorder, unspecified: Secondary | ICD-10-CM | POA: Diagnosis not present

## 2015-03-08 DIAGNOSIS — Z3202 Encounter for pregnancy test, result negative: Secondary | ICD-10-CM | POA: Diagnosis not present

## 2015-03-08 LAB — CBC
HCT: 36.3 % (ref 35.0–47.0)
Hemoglobin: 12.1 g/dL (ref 12.0–16.0)
MCH: 28.8 pg (ref 26.0–34.0)
MCHC: 33.2 g/dL (ref 32.0–36.0)
MCV: 86.6 fL (ref 80.0–100.0)
PLATELETS: 261 10*3/uL (ref 150–440)
RBC: 4.19 MIL/uL (ref 3.80–5.20)
RDW: 16.1 % — ABNORMAL HIGH (ref 11.5–14.5)
WBC: 5.7 10*3/uL (ref 3.6–11.0)

## 2015-03-08 LAB — URINALYSIS COMPLETE WITH MICROSCOPIC (ARMC ONLY)
BILIRUBIN URINE: NEGATIVE
Bacteria, UA: NONE SEEN
Glucose, UA: NEGATIVE mg/dL
KETONES UR: NEGATIVE mg/dL
Leukocytes, UA: NEGATIVE
Nitrite: NEGATIVE
PH: 6 (ref 5.0–8.0)
PROTEIN: NEGATIVE mg/dL
Specific Gravity, Urine: 1.021 (ref 1.005–1.030)

## 2015-03-08 LAB — PREGNANCY, URINE: Preg Test, Ur: NEGATIVE

## 2015-03-08 MED ORDER — NAPROXEN 500 MG PO TABS
500.0000 mg | ORAL_TABLET | Freq: Once | ORAL | Status: AC
  Administered 2015-03-08: 500 mg via ORAL
  Filled 2015-03-08: qty 1

## 2015-03-08 NOTE — Discharge Instructions (Signed)

## 2015-03-08 NOTE — ED Notes (Signed)
Pt had a baby 2 months ago, now having first period and having more painful cramping than usual, and filled a super plus tampon in one hour this am.

## 2015-03-08 NOTE — ED Notes (Signed)
Labs drawn   Family at bedside.

## 2015-03-08 NOTE — ED Notes (Signed)
States she started her period 2 days ago  Woke up this am with increased abd cramping and noticed heavier bleeding ..denies any clots. Or fever   No relief with OTC tylenol

## 2015-03-08 NOTE — ED Notes (Signed)
Peripheral iv's listed from July d/c'd from computer

## 2015-03-08 NOTE — ED Provider Notes (Signed)
[facility] Emergency Department Provider Note  ____________________________________________  Time seen: On arrival  I have reviewed the triage vital signs and the nursing notes.   HISTORY  Chief Complaint Abdominal Pain    HPI Carolyn [name] is a 19 y.o. female who presents with complaints of heavy menstrual bleeding. Patient reports she started her period 2 days ago. This is the first menstrual cycle she has had since the birth of her child 2 months ago. She reports it has become significant heavier this morning and she was concerned that she was too much blood. She denies dizziness. She has some mild pelvic cramping which is typical of her periods.     Past Medical History  Diagnosis Date  . Allergy   . Depression   . Pneumonia     Patient Active Problem List   Diagnosis Date Noted  . Hypertension in pregnancy, preeclampsia, severe, delivered 01/01/2015  . Postpartum care following vaginal delivery 12/31/2014  . Labor and delivery, indication for care 12/30/2014  . Abdominal pain 12/29/2014  . Labor and delivery indication for care or intervention 12/29/2014  . Braxton Hicks contractions 12/05/2014    History reviewed. No pertinent past surgical history.  Current Outpatient Rx  Name  Route  Sig  Dispense  Refill  . ibuprofen (ADVIL,MOTRIN) 200 MG tablet   Oral   Take 400 mg by mouth every 6 (six) hours as needed.         . chlorpheniramine-HYDROcodone (TUSSIONEX PENNKINETIC ER) 10-8 MG/5ML SUER   Oral   Take 5 mLs by mouth 2 (two) times daily.   140 mL   0   . doxycycline (VIBRA-TABS) 100 MG tablet   Oral   Take 1 tablet (100 mg total) by mouth 2 (two) times daily.   20 tablet   0   . HYDROcodone-acetaminophen (NORCO) 5-325 MG per tablet   Oral   Take 1 tablet by mouth every 4 (four) hours as needed for moderate pain.   6 tablet   0   . levofloxacin (LEVAQUIN) 750 MG tablet   Oral   Take 1 tablet (750 mg total) by mouth  daily.   5 tablet   0     Allergies Advair diskus; Sulfa antibiotics; Prednisone; and Banana  Family History  Problem Relation Age of Onset  . Hypertension Father     Social History Social History  Substance Use Topics  . Smoking status: Never Smoker   . Smokeless tobacco: None  . Alcohol Use: No    Review of Systems  Constitutional: Negative for fever. Eyes: Negative for visual changes. ENT: Negative for sore throat Cardiovascular: Negative for chest pain. Respiratory: Negative for shortness of breath. Gastrointestinal: Negative for abdominal pain, vomiting and diarrhea. Genitourinary: Negative for dysuria. Positive pelvic cramping Musculoskeletal: Negative for back pain. Skin: Negative for rash. Neurological: Negative for headaches or focal weakness Psychiatric: Anxiety    ____________________________________________   PHYSICAL EXAM:  VITAL SIGNS: ED Triage Vitals  Enc Vitals Group     BP 03/08/15 1000 121/74 mmHg     Pulse Rate 03/08/15 1000 75     Resp 03/08/15 1000 18     Temp 03/08/15 1000 98.2 F (36.8 C)     Temp Source 03/08/15 1000 Oral     SpO2 03/08/15 1000 100 %     Weight 03/08/15 1000 120 lb (54.432 kg)     Height 03/08/15 1000 4\' 11"  (1.499 m)     Head Cir --  Peak Flow --      Pain Score 03/08/15 1000 8     Pain Loc --      Pain Edu? --      Excl. in GC? --      Constitutional: Alert and oriented. Well appearing and in no distress. Eyes: Conjunctivae are normal.  ENT   Head: Normocephalic and atraumatic.   Mouth/Throat: Mucous membranes are moist. Cardiovascular: Normal rate, regular rhythm. Normal and symmetric distal pulses are present in all extremities. No murmurs, rubs, or gallops. Respiratory: Normal respiratory effort without tachypnea nor retractions. Breath sounds are clear and equal bilaterally.  Gastrointestinal: Soft and non-tender in all quadrants. No distention. There is no CVA tenderness. Genitourinary:  deferred Musculoskeletal: Nontender with normal range of motion in all extremities. No lower extremity tenderness nor edema. Neurologic:  Normal speech and language. No gross focal neurologic deficits are appreciated. Skin:  Skin is warm, dry and intact. No rash noted. Psychiatric: Mood and affect are normal. Patient exhibits appropriate insight and judgment.  ____________________________________________    LABS (pertinent positives/negatives)  Labs Reviewed  CBC - Abnormal; Notable for the following:    RDW 16.1 (*)    All other components within normal limits  URINALYSIS COMPLETEWITH MICROSCOPIC ([facility] ONLY) - Abnormal; Notable for the following:    Color, Urine YELLOW (*)    APPearance CLEAR (*)    Hgb urine dipstick 2+ (*)    Squamous Epithelial / LPF 0-5 (*)    All other components within normal limits  PREGNANCY, URINE    ____________________________________________   EKG  None  ____________________________________________    RADIOLOGY I have personally reviewed any xrays that were ordered on this patient: None  ____________________________________________   PROCEDURES  Procedure(s) performed: none  Critical Care performed: none  ____________________________________________   INITIAL IMPRESSION / ASSESSMENT AND PLAN / ED COURSE  Pertinent labs & imaging results that were available during my care of the patient were reviewed by me and considered in my medical decision making (see chart for details).  Blood pressure and vitals are all normal. Her hemoglobin is normal. She reports her symptoms improved while she is in the emergency department. We treated her with NSAIDs for her discomfort and will discharge her with PCP follow-up. Return precautions discussed  ____________________________________________   FINAL CLINICAL IMPRESSION(S) / ED DIAGNOSES  Final diagnoses:  Dysmenorrhea     Jene Every, MD 03/08/15 [phone]

## 2015-07-09 ENCOUNTER — Emergency Department
Admission: EM | Admit: 2015-07-09 | Discharge: 2015-07-09 | Disposition: A | Discharge status: Home / Self Care | Payer: Medicaid Other | Attending: Emergency Medicine | Admitting: Emergency Medicine

## 2015-07-09 ENCOUNTER — Emergency Department: Payer: Medicaid Other

## 2015-07-09 DIAGNOSIS — Z79899 Other long term (current) drug therapy: Secondary | ICD-10-CM | POA: Insufficient documentation

## 2015-07-09 DIAGNOSIS — R05 Cough: Secondary | ICD-10-CM | POA: Diagnosis present

## 2015-07-09 DIAGNOSIS — Z792 Long term (current) use of antibiotics: Secondary | ICD-10-CM | POA: Diagnosis not present

## 2015-07-09 DIAGNOSIS — Z23 Encounter for immunization: Secondary | ICD-10-CM | POA: Insufficient documentation

## 2015-07-09 DIAGNOSIS — J069 Acute upper respiratory infection, unspecified: Secondary | ICD-10-CM | POA: Insufficient documentation

## 2015-07-09 HISTORY — DX: Unspecified asthma, uncomplicated: J45.909

## 2015-07-09 MED ORDER — BENZONATATE 100 MG PO CAPS: ORAL_CAPSULE | ORAL | Status: DC

## 2015-07-09 MED ORDER — TETANUS-DIPHTH-ACELL PERTUSSIS 5-2.5-18.5 LF-MCG/0.5 IM SUSP
0.5000 mL | Freq: Once | INTRAMUSCULAR | Status: AC
  Administered 2015-07-09: 0.5 mL via INTRAMUSCULAR
  Filled 2015-07-09: qty 0.5

## 2015-07-09 NOTE — ED Notes (Signed)
Pt c/o cough with congestion for the past week, taking OTC without relief.

## 2015-07-09 NOTE — Discharge Instructions (Signed)
Upper Respiratory Infection, Adult Most upper respiratory infections (URIs) are caused by a virus. A URI affects the nose, throat, and upper air passages. The most common type of URI is often called "the common cold." HOME CARE   Take medicines only as told by your doctor.  Gargle warm saltwater or take cough drops to comfort your throat as told by your doctor.  Use a warm mist humidifier or inhale steam from a shower to increase air moisture. This may make it easier to breathe.  Drink enough fluid to keep your pee (urine) clear or pale yellow.  Eat soups and other clear broths.  Have a healthy diet.  Rest as needed.  Go back to work when your fever is gone or your doctor says it is okay.  You may need to stay home longer to avoid giving your URI to others.  You can also wear a face mask and wash your hands often to prevent spread of the virus.  Use your inhaler more if you have asthma.  Do not use any tobacco products, including cigarettes, chewing tobacco, or electronic cigarettes. If you need help quitting, ask your doctor. GET HELP IF:  You are getting worse, not better.  Your symptoms are not helped by medicine.  You have chills.  You are getting more short of breath.  You have brown or red mucus.  You have yellow or brown discharge from your nose.  You have pain in your face, especially when you bend forward.  You have a fever.  You have puffy (swollen) neck glands.  You have pain while swallowing.  You have white areas in the back of your throat. GET HELP RIGHT AWAY IF:   You have very bad or constant:  Headache.  Ear pain.  Pain in your forehead, behind your eyes, and over your cheekbones (sinus pain).  Chest pain.  You have long-lasting (chronic) lung disease and any of the following:  Wheezing.  Long-lasting cough.  Coughing up blood.  A change in your usual mucus.  You have a stiff neck.  You have changes in  your:  Vision.  Hearing.  Thinking.  Mood. MAKE SURE YOU:   Understand these instructions.  Will watch your condition.  Will get help right away if you are not doing well or get worse.   This information is not intended to replace advice given to you by your health care provider. Make sure you discuss any questions you have with your health care provider.   Document Released: 12/17/2007 Document Revised: 11/14/2014 Document Reviewed: 10/05/2013 Elsevier Interactive Patient Education 2016 [employer]   You will need to follow up with your doctor at [facility] if any continued problems. Increase fluids. Tylenol or ibuprofen if needed for pain. Tessalon pearls 1 or 2 every 8 hours as needed for cough. Continue taking your nasal decongestant as directed on the package.

## 2015-07-09 NOTE — ED Notes (Signed)
Assessed per PA 

## 2015-07-09 NOTE — ED Provider Notes (Signed)
[facility] Emergency Department Provider Note  ____________________________________________  Time seen: Approximately 4:13 PM  I have reviewed the triage vital signs and the nursing notes.   HISTORY  Chief Complaint Cough  HPI Carolyn [name] is a 19 y.o. female patient is here with cough and congestion for the last week. Patient states she's been taking Mucinex without relief. She states that the cough is keeping her up at night. She is unaware of any fever. She continues to eat and drink normally. There is been no vomiting or diarrhea.She rates her pain as 4 out of 10.   Past Medical History  Diagnosis Date  . Allergy   . Depression   . Pneumonia   . Asthma     Patient Active Problem List   Diagnosis Date Noted  . Hypertension in pregnancy, preeclampsia, severe, delivered 01/01/2015  . Postpartum care following vaginal delivery 12/31/2014  . Labor and delivery, indication for care 12/30/2014  . Abdominal pain 12/29/2014  . Labor and delivery indication for care or intervention 12/29/2014  . Braxton Hicks contractions 12/05/2014    History reviewed. No pertinent past surgical history.  Current Outpatient Rx  Name  Route  Sig  Dispense  Refill  . benzonatate (TESSALON PERLES) 100 MG capsule      Take 1-2 every 8 hours prn cough   30 capsule   0   . chlorpheniramine-HYDROcodone (TUSSIONEX PENNKINETIC ER) 10-8 MG/5ML SUER   Oral   Take 5 mLs by mouth 2 (two) times daily.   140 mL   0   . doxycycline (VIBRA-TABS) 100 MG tablet   Oral   Take 1 tablet (100 mg total) by mouth 2 (two) times daily.   20 tablet   0   . HYDROcodone-acetaminophen (NORCO) 5-325 MG per tablet   Oral   Take 1 tablet by mouth every 4 (four) hours as needed for moderate pain.   6 tablet   0   . ibuprofen (ADVIL,MOTRIN) 200 MG tablet   Oral   Take 400 mg by mouth every 6 (six) hours as needed.         Marland Kitchen levofloxacin (LEVAQUIN) 750 MG tablet   Oral   Take 1  tablet (750 mg total) by mouth daily.   5 tablet   0     Allergies Advair diskus; Sulfa antibiotics; Prednisone; and Banana  Family History  Problem Relation Age of Onset  . Hypertension Father     Social History Social History  Substance Use Topics  . Smoking status: Never Smoker   . Smokeless tobacco: None  . Alcohol Use: No    Review of Systems Constitutional: No fever/chills Eyes: No visual changes. ENT: No sore throat. Nasal congestion positive. Cardiovascular: Denies chest pain. Respiratory: Denies shortness of breath. Positive cough Gastrointestinal: No abdominal pain.  No nausea, no vomiting.  No diarrhea.   Genitourinary: Negative for dysuria. Musculoskeletal: Negative for back pain. Skin: Negative for rash. Neurological: Negative for headaches, focal weakness or numbness.  10-point ROS otherwise negative.  ____________________________________________   PHYSICAL EXAM:  VITAL SIGNS: ED Triage Vitals  Enc Vitals Group     BP 07/09/15 1536 143/93 mmHg     Pulse Rate 07/09/15 1536 110     Resp 07/09/15 1536 16     Temp 07/09/15 1535 98.2 F (36.8 C)     Temp Source 07/09/15 1535 Oral     SpO2 07/09/15 1536 100 %     Weight 07/09/15 1536 115  lb (52.164 kg)     Height 07/09/15 1536 4\' 11"  (1.499 m)     Head Cir --      Peak Flow --      Pain Score 07/09/15 1536 4     Pain Loc --      Pain Edu? --      Excl. in GC? --     Constitutional: Alert and oriented. Well appearing and in no acute distress. Eyes: Conjunctivae are normal. PERRL. EOMI. Head: Atraumatic. Nose: No congestion/rhinnorhea. Mouth/Throat: Mucous membranes are moist.  Oropharynx non-erythematous. Neck: No stridor.   Hematological/Lymphatic/Immunilogical: No cervical lymphadenopathy. Cardiovascular: Normal rate, regular rhythm. Grossly normal heart sounds.  Good peripheral circulation. Respiratory: Normal respiratory effort.  No retractions. Lungs CTAB. Occasional cough  nonproductive. Gastrointestinal: Soft and nontender. No distention.  Musculoskeletal: Moves upper and lower extremities without any difficulty. Neurologic:  Normal speech and language. No gross focal neurologic deficits are appreciated. No gait instability. Skin:  Skin is warm, dry and intact. No rash noted. Psychiatric: Mood and affect are normal. Speech and behavior are normal.  ____________________________________________   LABS (all labs ordered are listed, but only abnormal results are displayed)  Labs Reviewed - No data to display RADIOLOGY  Chest x-ray per radiology showed no active to his cardiopulmonary disease. ____________________________________________   PROCEDURES  Procedure(s) performed: None  Critical Care performed: No  ____________________________________________   INITIAL IMPRESSION / ASSESSMENT AND PLAN / ED COURSE  Pertinent labs & imaging results that were available during my care of the patient were reviewed by me and considered in my medical decision making (see chart for details).  Patient is continue taking Mucinex as needed for congestion. She is given a prescription for Tessalon Perles 1 or 2 every 8 hours as needed for cough. She is encouraged to increase fluids. She'll follow-up with her PCP if any continued problems. ____________________________________________   FINAL CLINICAL IMPRESSION(S) / ED DIAGNOSES  Final diagnoses:  Acute upper respiratory infection      Tommi RumpsRhonda L Maryalyce Sanjuan, PA-C 07/09/15 2125  Loleta Roseory Forbach, MD 07/09/15 2325

## 2015-07-15 NOTE — L&D Delivery Note (Signed)
Delivery Note Primary OB: No prenatal care and other Delivery Physician: Annamarie MajorPaul Karlyn Glasco, MD Gestational Age: Full term Antepartum complications: No PNC. Planning adoption. Intrapartum complications: Precipitous labor  A viable Female was delivered via vertex perentation.  Apgars:8 ,9  Weight:  6 lb 5 oz .   Placenta status: spontaneous and Intact.  Cord: 3+ vessels;  with the following complications: none.  Anesthesia:  none Episiotomy:  none Lacerations:  none Suture Repair: none Est. Blood Loss (mL):  less than 100 mL  Mom to postpartum.  Baby to nursery, planning for adoption.  Annamarie MajorPaul Shadara Lopez, MD Dept of OB/GYN [phone]

## 2016-01-31 ENCOUNTER — Encounter: Payer: Self-pay | Admitting: Emergency Medicine

## 2016-01-31 ENCOUNTER — Emergency Department
Admission: EM | Admit: 2016-01-31 | Discharge: 2016-02-01 | Disposition: A | Discharge status: Home / Self Care | Payer: Self-pay | Attending: Emergency Medicine | Admitting: Emergency Medicine

## 2016-01-31 ENCOUNTER — Emergency Department: Payer: Medicaid Other

## 2016-01-31 DIAGNOSIS — Z3A36 36 weeks gestation of pregnancy: Secondary | ICD-10-CM | POA: Insufficient documentation

## 2016-01-31 DIAGNOSIS — R102 Pelvic and perineal pain: Secondary | ICD-10-CM | POA: Insufficient documentation

## 2016-01-31 DIAGNOSIS — Z3493 Encounter for supervision of normal pregnancy, unspecified, third trimester: Secondary | ICD-10-CM

## 2016-01-31 DIAGNOSIS — O26899 Other specified pregnancy related conditions, unspecified trimester: Secondary | ICD-10-CM

## 2016-01-31 DIAGNOSIS — J45909 Unspecified asthma, uncomplicated: Secondary | ICD-10-CM | POA: Insufficient documentation

## 2016-01-31 DIAGNOSIS — O26893 Other specified pregnancy related conditions, third trimester: Secondary | ICD-10-CM | POA: Insufficient documentation

## 2016-01-31 DIAGNOSIS — F329 Major depressive disorder, single episode, unspecified: Secondary | ICD-10-CM | POA: Insufficient documentation

## 2016-01-31 DIAGNOSIS — R52 Pain, unspecified: Secondary | ICD-10-CM

## 2016-01-31 LAB — URINALYSIS COMPLETE WITH MICROSCOPIC (ARMC ONLY)
BILIRUBIN URINE: NEGATIVE
Bacteria, UA: NONE SEEN
Glucose, UA: NEGATIVE mg/dL
HGB URINE DIPSTICK: NEGATIVE
Ketones, ur: NEGATIVE mg/dL
Nitrite: NEGATIVE
Protein, ur: 30 mg/dL — AB
Specific Gravity, Urine: 1.023 (ref 1.005–1.030)
pH: 6 (ref 5.0–8.0)

## 2016-01-31 LAB — BASIC METABOLIC PANEL
Anion gap: 6 (ref 5–15)
BUN: 6 mg/dL (ref 6–20)
CHLORIDE: 108 mmol/L (ref 101–111)
CO2: 22 mmol/L (ref 22–32)
Calcium: 8.5 mg/dL — ABNORMAL LOW (ref 8.9–10.3)
Creatinine, Ser: 0.56 mg/dL (ref 0.44–1.00)
GFR calc Af Amer: >60 mL/min (ref >60)
GFR calc non Af Amer: >60 mL/min (ref >60)
GLUCOSE: 107 mg/dL — AB (ref 65–99)
POTASSIUM: 3.3 mmol/L — AB (ref 3.5–5.1)
SODIUM: 136 mmol/L (ref 135–145)

## 2016-01-31 LAB — CBC
HEMATOCRIT: 32.3 % — AB (ref 35.0–47.0)
HEMOGLOBIN: 10.4 g/dL — AB (ref 12.0–16.0)
MCH: 23.7 pg — AB (ref 26.0–34.0)
MCHC: 32.3 g/dL (ref 32.0–36.0)
MCV: 73.4 fL — AB (ref 80.0–100.0)
Platelets: 157 10*3/uL (ref 150–440)
RBC: 4.4 MIL/uL (ref 3.80–5.20)
RDW: 17.7 % — ABNORMAL HIGH (ref 11.5–14.5)
WBC: 7.6 10*3/uL (ref 3.6–11.0)

## 2016-01-31 LAB — HCG, QUANTITATIVE, PREGNANCY: HCG, BETA CHAIN, QUANT, S: 21167 m[IU]/mL — AB (ref <5)

## 2016-01-31 LAB — PREGNANCY, URINE: PREG TEST UR: POSITIVE — AB

## 2016-01-31 LAB — ABO/RH: ABO/RH(D): O POS

## 2016-01-31 NOTE — ED Notes (Signed)
LDR called to give them an update on patient status; patient in a room and being seen by EDP at this time. Labs completed and U/S done. Patient with a single live IUP; 36 weeks 1 day. LDR nurse Eileen Stanford(Jenna) made aware that primary nurse would be calling her with an official report on the patient.

## 2016-01-31 NOTE — ED Notes (Addendum)
FIRST NURSE NOTE: Called LDR and spoke with the charge nurse. Made aware that patient is a G2P1 - unknown LMP; states, "I think it was back in October". Patient has had no PNC for this pregnancy. Reporting RIGHT lower abdominal pain x [redacted] weeks along with extreme fatigue and HYPERsomnia (14-16 hours a day). Patient reports that she does feel her baby moving and that she has experienced no vaginal bleeding. LDR charge nurse spoke with Dr. Jean RosenthalJackson and MD wanting patient to have blood and US in the ED prior to coming to LDR. Spoke with Dr. Alphonzo LemmingsMcShane and VORB received from CBC, BMP, group and RH, UA, and an OB ultrasound; orders to be entered by this RN.

## 2016-01-31 NOTE — ED Notes (Signed)
Patient reports that she is pregnant, unsure of how far along. Patient states that her last period was at the beginning of October. Patient reports that she has had intermittent right lower abd pain that started about 2 weeks ago. Patient also reports that she has had fatigue over the last 2-3 days. Patient reports that she is sleeping around 14 hours a day.

## 2016-02-01 ENCOUNTER — Emergency Department
Admission: EM | Admit: 2016-02-01 | Discharge: 2016-02-01 | Discharge status: Absent without leave | Payer: Medicaid Other

## 2016-02-01 ENCOUNTER — Inpatient Hospital Stay
Admit: 2016-02-01 | Discharge: 2016-02-01 | Disposition: A | Discharge status: Home / Self Care | Payer: Medicaid Other | Source: Home / Self Care | Attending: Obstetrics and Gynecology | Admitting: Obstetrics and Gynecology

## 2016-02-01 DIAGNOSIS — Z8632 Personal history of gestational diabetes: Secondary | ICD-10-CM | POA: Insufficient documentation

## 2016-02-01 DIAGNOSIS — O99343 Other mental disorders complicating pregnancy, third trimester: Secondary | ICD-10-CM | POA: Insufficient documentation

## 2016-02-01 DIAGNOSIS — O26893 Other specified pregnancy related conditions, third trimester: Secondary | ICD-10-CM

## 2016-02-01 DIAGNOSIS — Z3A36 36 weeks gestation of pregnancy: Secondary | ICD-10-CM

## 2016-02-01 DIAGNOSIS — R400 Somnolence: Secondary | ICD-10-CM

## 2016-02-01 DIAGNOSIS — R1031 Right lower quadrant pain: Secondary | ICD-10-CM | POA: Insufficient documentation

## 2016-02-01 DIAGNOSIS — F329 Major depressive disorder, single episode, unspecified: Secondary | ICD-10-CM

## 2016-02-01 LAB — URINE DRUG SCREEN, QUALITATIVE (ARMC ONLY)
AMPHETAMINES, UR SCREEN: NOT DETECTED
Barbiturates, Ur Screen: NOT DETECTED
Benzodiazepine, Ur Scrn: NOT DETECTED
COCAINE METABOLITE, UR ~~LOC~~: NOT DETECTED
Cannabinoid 50 Ng, Ur ~~LOC~~: NOT DETECTED
MDMA (ECSTASY) UR SCREEN: NOT DETECTED
METHADONE SCREEN, URINE: NOT DETECTED
OPIATE, UR SCREEN: NOT DETECTED
PHENCYCLIDINE (PCP) UR S: NOT DETECTED
Tricyclic, Ur Screen: NOT DETECTED

## 2016-02-01 LAB — GLUCOSE, CAPILLARY: Glucose-Capillary: 79 mg/dL (ref 65–99)

## 2016-02-01 LAB — RAPID HIV SCREEN (HIV 1/2 AB+AG)
HIV 1/2 ANTIBODIES: NONREACTIVE
HIV-1 P24 Antigen - HIV24: NONREACTIVE

## 2016-02-01 LAB — CBC WITH DIFFERENTIAL/PLATELET
BASOS PCT: 0 %
Basophils Absolute: 0 10*3/uL (ref 0–0.1)
Eosinophils Absolute: 0.1 10*3/uL (ref 0–0.7)
Eosinophils Relative: 1 %
HEMATOCRIT: 31 % — AB (ref 35.0–47.0)
Hemoglobin: 9.9 g/dL — ABNORMAL LOW (ref 12.0–16.0)
LYMPHS PCT: 21 %
Lymphs Abs: 1.5 10*3/uL (ref 1.0–3.6)
MCH: 23.7 pg — AB (ref 26.0–34.0)
MCHC: 31.9 g/dL — AB (ref 32.0–36.0)
MCV: 74.4 fL — AB (ref 80.0–100.0)
Monocytes Absolute: 0.4 10*3/uL (ref 0.2–0.9)
Monocytes Relative: 5 %
Neutro Abs: 5.2 10*3/uL (ref 1.4–6.5)
Neutrophils Relative %: 73 %
Platelets: 157 10*3/uL (ref 150–440)
RBC: 4.17 MIL/uL (ref 3.80–5.20)
RDW: 17.8 % — AB (ref 11.5–14.5)
WBC: 7.2 10*3/uL (ref 3.6–11.0)

## 2016-02-01 LAB — TYPE AND SCREEN
ABO/RH(D): O POS
ANTIBODY SCREEN: NEGATIVE

## 2016-02-01 LAB — CHLAMYDIA/NGC RT PCR (ARMC ONLY)
Chlamydia Tr: NOT DETECTED
N GONORRHOEAE: NOT DETECTED

## 2016-02-01 LAB — TSH: TSH: 1.814 u[IU]/mL (ref 0.350–4.500)

## 2016-02-01 NOTE — Discharge Summary (Signed)
Discharge instructions reviewed with patient including need to schedule follow up appointment with OB office and when to seek medical attention. Labor precautions given and reviewed with patient. All questions answered. Patient discharged home, ambulatory with steady gait, escorted by significant other. No signs of distress observed at time of discharge.

## 2016-02-01 NOTE — Final Progress Note (Signed)
A full History and Physical was written for this encounter. The entire encounter, apart from lab results, is documented in the H&P.  Thomasene MohairStephen Jackson, MD 02/01/2016 2:25 AM

## 2016-02-01 NOTE — ED Provider Notes (Signed)
[facility] Emergency Department Provider Note  ____________________________________________  Time seen: 11:45 PM  I have reviewed the triage vital signs and the nursing notes.   HISTORY  Chief Complaint Abdominal Pain and Fatigue     HPI Carolyn [name] is a 20 y.o. female G2 para 1 approximately [redacted] weeks pregnant presents with fatigue and increased somnolence over the past week. Patient states that she sleeps approximately 16 hours each day     Past Medical History  Diagnosis Date  . Allergy   . Depression   . Pneumonia   . Asthma     Patient Active Problem List   Diagnosis Date Noted  . Hypertension in pregnancy, preeclampsia, severe, delivered 01/01/2015  . Postpartum care following vaginal delivery 12/31/2014  . Labor and delivery, indication for care 12/30/2014  . Abdominal pain 12/29/2014  . Labor and delivery indication for care or intervention 12/29/2014  . Braxton Hicks contractions 12/05/2014    History reviewed. No pertinent past surgical history.  Current Outpatient Rx  Name  Route  Sig  Dispense  Refill  . benzonatate (TESSALON PERLES) 100 MG capsule      Take 1-2 every 8 hours prn cough   30 capsule   0   . chlorpheniramine-HYDROcodone (TUSSIONEX PENNKINETIC ER) 10-8 MG/5ML SUER   Oral   Take 5 mLs by mouth 2 (two) times daily.   140 mL   0   . doxycycline (VIBRA-TABS) 100 MG tablet   Oral   Take 1 tablet (100 mg total) by mouth 2 (two) times daily.   20 tablet   0   . HYDROcodone-acetaminophen (NORCO) 5-325 MG per tablet   Oral   Take 1 tablet by mouth every 4 (four) hours as needed for moderate pain.   6 tablet   0   . ibuprofen (ADVIL,MOTRIN) 200 MG tablet   Oral   Take 400 mg by mouth every 6 (six) hours as needed.         Marland Kitchen. levofloxacin (LEVAQUIN) 750 MG tablet   Oral   Take 1 tablet (750 mg total) by mouth daily.   5 tablet   0     Allergies Advair diskus; Sulfa antibiotics; Prednisone; and  Banana  Family History  Problem Relation Age of Onset  . Hypertension Father     Social History Social History  Substance Use Topics  . Smoking status: Never Smoker   . Smokeless tobacco: None  . Alcohol Use: No    Review of Systems  Constitutional: Negative for fever.Positive for fatigue Eyes: Negative for visual changes. ENT: Negative for sore throat. Cardiovascular: Negative for chest pain. Respiratory: Negative for shortness of breath. Gastrointestinal: Negative for abdominal pain, vomiting and diarrhea. Genitourinary: Negative for dysuria. Musculoskeletal: Negative for back pain. Skin: Negative for rash. Neurological: Negative for headaches, focal weakness or numbness.   10-point ROS otherwise negative.  ____________________________________________   PHYSICAL EXAM:  VITAL SIGNS: ED Triage Vitals  Enc Vitals Group     BP 01/31/16 2201 128/99 mmHg     Pulse Rate 01/31/16 2201 93     Resp 01/31/16 2201 18     Temp 01/31/16 2201 98.2 F (36.8 C)     Temp Source 01/31/16 2201 Oral     SpO2 01/31/16 2201 100 %     Weight 01/31/16 2201 130 lb (58.968 kg)     Height 01/31/16 2201 4\' 11"  (1.499 m)     Head Cir --  Peak Flow --      Pain Score 01/31/16 2202 5     Pain Loc --      Pain Edu? --      Excl. in GC? --      Constitutional: Alert and oriented. Well appearing and in no distress. Eyes: Conjunctivae are normal. PERRL. Normal extraocular movements. ENT   Head: Normocephalic and atraumatic.   Nose: No congestion/rhinnorhea.   Mouth/Throat: Mucous membranes are moist.   Neck: No stridor. Hematological/Lymphatic/Immunilogical: No cervical lymphadenopathy. Cardiovascular: Normal rate, regular rhythm. Normal and symmetric distal pulses are present in all extremities. No murmurs, rubs, or gallops. Respiratory: Normal respiratory effort without tachypnea nor retractions. Breath sounds are clear and equal bilaterally. No  wheezes/rales/rhonchi. Gastrointestinal: Soft and nontender. No distention. There is no CVA tenderness. Genitourinary: deferred Musculoskeletal: Nontender with normal range of motion in all extremities. No joint effusions.  No lower extremity tenderness nor edema. Neurologic:  Normal speech and language. No gross focal neurologic deficits are appreciated. Speech is normal.  Skin:  Skin is warm, dry and intact. No rash noted. Psychiatric: Mood and affect are normal. Speech and behavior are normal. Patient exhibits appropriate insight and judgment.  ____________________________________________    LABS (pertinent positives/negatives)  Labs Reviewed  CBC - Abnormal; Notable for the following:    Hemoglobin 10.4 (*)    HCT 32.3 (*)    MCV 73.4 (*)    MCH 23.7 (*)    RDW 17.7 (*)    All other components within normal limits  BASIC METABOLIC PANEL - Abnormal; Notable for the following:    Potassium 3.3 (*)    Glucose, Bld 107 (*)    Calcium 8.5 (*)    All other components within normal limits  HCG, QUANTITATIVE, PREGNANCY - Abnormal; Notable for the following:    hCG, Beta Chain, Sharene Butters, S 21167 (*)    All other components within normal limits  URINALYSIS COMPLETEWITH MICROSCOPIC ([facility] ONLY) - Abnormal; Notable for the following:    Color, Urine YELLOW (*)    APPearance CLOUDY (*)    Protein, ur 30 (*)    Leukocytes, UA 3+ (*)    Squamous Epithelial / LPF TOO NUMEROUS TO COUNT (*)    All other components within normal limits  PREGNANCY, URINE - Abnormal; Notable for the following:    Preg Test, Ur POSITIVE (*)    All other components within normal limits  ABO/RH       RADIOLOGY  US OB Limited (Final result) Result time: 01/31/16 23:19:49   Final result by Rad Results In Interface (01/31/16 23:19:49)   Narrative:   CLINICAL DATA: Pain for 2 weeks. The patient is pregnant but has had no prenatal care.  EXAM: OBSTETRIC <14 WK ULTRASOUND  TECHNIQUE: Transabdominal  ultrasound was performed for evaluation of the gestation as well as the maternal uterus and adnexal regions.  COMPARISON: None.  FINDINGS: There is a single live IUP in a breech presentation with a Fetal Heart rate of 157 beats per minute. Fetal motion is identified. The placenta is located near the fundus with no evidence of previa. The placenta is at least 22 cm from the internal cervical os. The amniotic fluid is unremarkable. The largest vertical pocket is 5.3 cm. The biparietal diameter is 8.93 cm correlating with a gestational age of [redacted] weeks and 1 day. No other abnormalities. The cervix was not measured.  IMPRESSION: Single live IUP. The patient is 36 weeks and 1 day pregnant by biparietal diameter.  Electronically Signed By: Gerome Sam III M.D On: 01/31/2016 23:19         Procedures     INITIAL IMPRESSION / ASSESSMENT AND PLAN / ED COURSE  Pertinent labs & imaging results that were available during my care of the patient were reviewed by me and considered in my medical decision making (see chart for details).  Patient discussed with Dr. Jean Rosenthal OB/GYN no clear etiology identified for the patient's right pelvic pain pain or profound fatigue as such patient will be admitted to OB/GYN for further evaluation and management.  ____________________________________________   FINAL CLINICAL IMPRESSION(S) / ED DIAGNOSES  Final diagnoses:  Pelvic pain affecting pregnancy  Third trimester pregnancy      Darci Current, MD 02/01/16 [phone]

## 2016-02-01 NOTE — H&P (Signed)
OB History & Physical   History of Present Illness:  Chief Complaint: right lower quadrant abdominal pain, somnolence  HPI:  Carolyn [name] is a 20 y.o. 662P1001 female at 7170w2d dated by an ultrasound in the ED tonight.  Her pregnancy has been complicated by no prenatal care, a history of gestational diabetes and preeclampsia in her prior pregnancy.  She presents with several weeks of right lower quadrant abdominal pain.  The pain is described as dull. It does not radiate. Nothing makes it better or worse.  The pain level is 5/10. There are no associated symptoms.  She also notes increased somnolence for the past three days.  She sleeps from about 230 am till 6pm.  She was evaluated for this in the ED today with no etiology found.  An ultrasound in the ED dates the pregnancy at 36 weeks based on biparietal diameter.  The fetus is in breech presentation.    She denies contractions.   She denies leakage of fluid.   She denies vaginal bleeding.   She reports fetal movement.    Maternal Medical History:   Past Medical History  Diagnosis Date  . Allergy   . Depression   . Pneumonia   . Asthma    Past Surgical History: None  Allergies  Allergen Reactions  . Advair Diskus [Fluticasone-Salmeterol] Other (See Comments)    Pt states she "blacked out"  . Sulfa Antibiotics Other (See Comments)    Infant  . Prednisone     Made her bonkers  . Banana Itching    Prior to Admission medications: patient is taking no medication    OB History  Gravida Para Term Preterm AB SAB TAB Ectopic Multiple Living  2 1 1       0 1    # Outcome Date GA Lbr Len/2nd Weight Sex Delivery Anes PTL Lv  2 Current           1 Term 12/30/14 6430w5d / 00:17 2.92 kg (6 lb 7 oz) M Vag-Spont   Y      Prenatal care site: None.  This is her first presentation this pregnancy.  Social History: She  reports that she has never smoked. She does not have any smokeless tobacco history on file. She reports that she does not  drink alcohol or use illicit drugs.  Family History: family history includes Hypertension in her father.   Review of Systems: Negative x 10 systems reviewed except as noted in the HPI.    Physical Exam:  Vital Signs: BP 113/82 mmHg  Pulse 73  Temp(Src) 98 F (36.7 C) (Oral)  Resp 18  Ht 4\' 11"  (1.499 m)  Wt 58.968 kg (130 lb)  BMI 26.24 kg/m2  LMP 05/10/2015 General: no acute distress.  HEENT: normocephalic, atraumatic Heart: regular rate & rhythm.  No murmurs/rubs/gallops Lungs: clear to auscultation bilaterally Abdomen: soft, gravid, non-tender;  EFW: 6 pounds Pelvic: (female chaperone present during pelvic exam)  External: Normal external female genitalia  Cervix: Dilation: 1 / Effacement (%): 50 / Station: -2  Extremities: non-tender, symmetric, no edema bilaterally.  DTRs: 2+  Neurologic: Alert & oriented x 3.    Pertinent Results:  Prenatal Labs: Blood type/Rh O positive base on last pregnancy reports  All other prenatal labs pending.  Baseline FHR: 125 beats/min   Variability: moderate   Accelerations: present   Decelerations: absent Contractions: present frequency: infrequent Overall assessment: category 1  Assessment:  Carolyn [name] is a 20 y.o. 62P1001 female  at [redacted]w[redacted]d with no prenatal care. She has a history of GDM and preeclampsia in her prior pregnancy.  No etiology evident for her somnolence.  She has a history of depression and we discussed that this may very well be the cause. No etiology for her abdominal pain is found. We discussed the breech presentation and I strongly encouraged her to get established ASAP to discussed possible ECV for the fetus and/or schedule a cesarean delivery.  We discussed the benefits of a planned cesarean section over an urgent one if she were to present in labor.     Plan:  1. Prenatal labs, UDS, GBS, gonorrhea/chlamydia testing. 2. CBC, CMP, UA ordered in ER. 3. Follow up ASAP in clinic. 4. [place]etwal well-being:  reassuring.   Conard Novak, MD 02/01/2016 2:16 AM

## 2016-02-02 LAB — VARICELLA ZOSTER ANTIBODY, IGG: VARICELLA IGG: 224 {index} (ref >165)

## 2016-02-02 LAB — RUBELLA SCREEN: RUBELLA: 1.16 {index} (ref >0.99)

## 2016-02-02 LAB — RPR: RPR: NONREACTIVE

## 2016-02-02 LAB — HEPATITIS B SURFACE ANTIGEN: Hepatitis B Surface Ag: NEGATIVE

## 2016-02-02 LAB — HIV ANTIBODY (ROUTINE TESTING W REFLEX): HIV Screen 4th Generation wRfx: NONREACTIVE

## 2016-02-04 LAB — CULTURE, BETA STREP (GROUP B ONLY)

## 2016-02-07 ENCOUNTER — Observation Stay
Admission: EM | Admit: 2016-02-07 | Discharge: 2016-02-07 | Disposition: A | Discharge status: Home / Self Care | Payer: Medicaid Other | Attending: Obstetrics and Gynecology | Admitting: Obstetrics and Gynecology

## 2016-02-07 ENCOUNTER — Encounter: Payer: Self-pay | Admitting: *Deleted

## 2016-02-07 DIAGNOSIS — Z91018 Allergy to other foods: Secondary | ICD-10-CM | POA: Insufficient documentation

## 2016-02-07 DIAGNOSIS — Z888 Allergy status to other drugs, medicaments and biological substances status: Secondary | ICD-10-CM | POA: Insufficient documentation

## 2016-02-07 DIAGNOSIS — J45909 Unspecified asthma, uncomplicated: Secondary | ICD-10-CM | POA: Insufficient documentation

## 2016-02-07 DIAGNOSIS — O99820 Streptococcus B carrier state complicating pregnancy: Secondary | ICD-10-CM | POA: Insufficient documentation

## 2016-02-07 DIAGNOSIS — Z882 Allergy status to sulfonamides status: Secondary | ICD-10-CM | POA: Insufficient documentation

## 2016-02-07 DIAGNOSIS — O479 False labor, unspecified: Secondary | ICD-10-CM | POA: Diagnosis present

## 2016-02-07 DIAGNOSIS — Z3A37 37 weeks gestation of pregnancy: Secondary | ICD-10-CM | POA: Insufficient documentation

## 2016-02-07 DIAGNOSIS — F329 Major depressive disorder, single episode, unspecified: Secondary | ICD-10-CM | POA: Insufficient documentation

## 2016-02-07 DIAGNOSIS — O99343 Other mental disorders complicating pregnancy, third trimester: Secondary | ICD-10-CM | POA: Insufficient documentation

## 2016-02-07 DIAGNOSIS — O471 False labor at or after 37 completed weeks of gestation: Principal | ICD-10-CM | POA: Insufficient documentation

## 2016-02-07 DIAGNOSIS — O99513 Diseases of the respiratory system complicating pregnancy, third trimester: Secondary | ICD-10-CM | POA: Insufficient documentation

## 2016-02-07 NOTE — Discharge Summary (Signed)
Late Entry:   Pt was re-examined after approximately 2 hours from initial exam by RN and found to be unchanged. She was discharged home with labor precautions. Pt to f/u at [facility] with previously scheduled appt.

## 2016-02-07 NOTE — H&P (Signed)
Obstetric History and Physical  Carolyn [name] is a 20 y.o. G2P1001 with Estimated Date of Delivery: 02/27/16 per 36 wk [place] who presents at [redacted]w[redacted]d  presenting for contractions. Pt initially reported pain of 0/10, increased to 4/10 after an hour. Patient states she has been having regular contractions, no vaginal bleeding, intact membranes, with active fetal movement.    Prenatal Course Source of Care: [facility]  with onset of care at 37 weeks Pregnancy complications or risks: Late to care  Plans adoption H/o GDM and pre-eclampsia with G1  Patient Active Problem List   Diagnosis Date Noted  . Irregular contractions 02/07/2016    Prenatal labs and studies: ABO, Rh: O+  Antibody: Neg Rubella: Immune Varicella: Immune RPR:  NR HBsAg:  Neg HIV: Neg GC/CT: Neg/Neg GBS: positive 1 hr Glucola: random glucoses normal    Prenatal Transfer Tool   Past Medical History:  Diagnosis Date  . Allergy   . Asthma   . Depression   . Pneumonia     History reviewed. No pertinent surgical history.  OB History  Gravida Para Term Preterm AB Living  2 1 1     1   SAB TAB Ectopic Multiple Live Births        0 1    # Outcome Date GA Lbr Len/2nd Weight Sex Delivery Anes PTL Lv  2 Current           1 Term 12/30/14 [redacted]w[redacted]d / 00:17 6 lb 7 oz (2.92 kg) M Vag-Spont   LIV     Medications: none  Social History   Social History  . Marital status: Single    Spouse name: N/A  . Number of children: N/A  . Years of education: N/A   Social History Main Topics  . Smoking status: Never Smoker  . Smokeless tobacco: Never Used  . Alcohol use No  . Drug use: No  . Sexual activity: Not Asked   Other Topics Concern  . None   Social History Narrative  . None    Family History  Problem Relation Age of Onset  . Hypertension Father       Allergies  Allergen Reactions  . Advair Diskus [Fluticasone-Salmeterol] Other (See Comments)    Pt states she "blacked out"  . Sulfa Antibiotics Other (See  Comments)    Infant  . Prednisone     Made her bonkers  . Banana Itching    Review of Systems: Negative except for what is mentioned in HPI.  Physical Exam: BP 121/79   Pulse 79   Temp 98.5 F (36.9 C) (Oral)   Resp 16   LMP 05/10/2015  GENERAL: Well-developed, well-nourished female in no acute distress.  LUNGS: NO increased work of breathing ABDOMEN: Soft, nontender, nondistended, gravid. EXTREMITIES: Nontender, no edema Cervical Exam: Dilatation 2 cm   Effacement 60 %   Station -2   Presentation: cephalic FHT: Category: 1 Baseline rate 130 bpm   Variability moderate  Accelerations present   Decelerations none Contractions: Every 4-5 mins   Pertinent Labs/Studies:   No results found for this or any previous visit (from the past 24 hour(s)).  Assessment : IUP at [redacted]w[redacted]d, early labor  Plan: Observe for cervical change - may ambulate in hallways if desired

## 2016-02-07 NOTE — Discharge Instructions (Signed)
Come back if: ° °Big gush of fluid °Temp over 100.4 °Heavy vaginal bleeding °Decreased fetal movement °Contractions every 3-5 min lasting at least one hour ° °Get plenty of rest and stay well hydrated! °

## 2016-02-07 NOTE — OB Triage Note (Signed)
Recvd from ED. C/o contractions but has not timed them out and says they are not painful. She states it feels like she has to urinate when she gets them. No vaginal bleeding and no intercourse in past 24 hours. Feeling baby move okay.

## 2016-02-28 ENCOUNTER — Encounter: Payer: Self-pay | Admitting: *Deleted

## 2016-02-28 ENCOUNTER — Inpatient Hospital Stay
Admission: EM | Admit: 2016-02-28 | Discharge: 2016-03-01 | DRG: 775 | Disposition: A | Discharge status: Home / Self Care | Payer: Medicaid Other | Attending: Obstetrics & Gynecology | Admitting: Obstetrics & Gynecology

## 2016-02-28 DIAGNOSIS — D62 Acute posthemorrhagic anemia: Secondary | ICD-10-CM | POA: Diagnosis present

## 2016-02-28 DIAGNOSIS — O99824 Streptococcus B carrier state complicating childbirth: Principal | ICD-10-CM | POA: Diagnosis present

## 2016-02-28 DIAGNOSIS — Z8249 Family history of ischemic heart disease and other diseases of the circulatory system: Secondary | ICD-10-CM

## 2016-02-28 DIAGNOSIS — Z3A40 40 weeks gestation of pregnancy: Secondary | ICD-10-CM

## 2016-02-28 DIAGNOSIS — Z882 Allergy status to sulfonamides status: Secondary | ICD-10-CM

## 2016-02-28 DIAGNOSIS — Z888 Allergy status to other drugs, medicaments and biological substances status: Secondary | ICD-10-CM

## 2016-02-28 DIAGNOSIS — O9902 Anemia complicating childbirth: Secondary | ICD-10-CM | POA: Diagnosis present

## 2016-02-28 LAB — CBC
HCT: 33.7 % — ABNORMAL LOW (ref 35.0–47.0)
Hemoglobin: 10.8 g/dL — ABNORMAL LOW (ref 12.0–16.0)
MCH: 23 pg — AB (ref 26.0–34.0)
MCHC: 32.1 g/dL (ref 32.0–36.0)
MCV: 71.6 fL — AB (ref 80.0–100.0)
PLATELETS: 202 10*3/uL (ref 150–440)
RBC: 4.71 MIL/uL (ref 3.80–5.20)
RDW: 18.4 % — AB (ref 11.5–14.5)
WBC: 8.7 10*3/uL (ref 3.6–11.0)

## 2016-02-28 LAB — TYPE AND SCREEN
ABO/RH(D): O POS
Antibody Screen: NEGATIVE

## 2016-02-28 MED ORDER — COCONUT OIL OIL: 1.0000 "application " | TOPICAL_OIL | Status: DC | PRN

## 2016-02-28 MED ORDER — MISOPROSTOL 200 MCG PO TABS
ORAL_TABLET | ORAL | Status: AC
  Filled 2016-02-28: qty 4

## 2016-02-28 MED ORDER — LACTATED RINGERS IV SOLN: INTRAVENOUS | Status: DC

## 2016-02-28 MED ORDER — KETOROLAC TROMETHAMINE 30 MG/ML IJ SOLN
INTRAMUSCULAR | Status: AC
  Administered 2016-02-28: 30 mg via INTRAVENOUS
  Filled 2016-02-28: qty 1

## 2016-02-28 MED ORDER — BENZOCAINE-MENTHOL 20-0.5 % EX AERO: 1.0000 "application " | INHALATION_SPRAY | CUTANEOUS | Status: DC | PRN

## 2016-02-28 MED ORDER — OXYCODONE-ACETAMINOPHEN 5-325 MG PO TABS
1.0000 | ORAL_TABLET | ORAL | Status: DC | PRN
  Administered 2016-02-28: 2 via ORAL
  Administered 2016-02-28 (×2): 1 via ORAL
  Administered 2016-02-28 – 2016-03-01 (×9): 2 via ORAL
  Filled 2016-02-28 (×10): qty 2
  Filled 2016-02-28: qty 1
  Filled 2016-02-28: qty 2
  Filled 2016-02-28: qty 1
  Filled 2016-02-28: qty 2

## 2016-02-28 MED ORDER — BUTORPHANOL TARTRATE 1 MG/ML IJ SOLN: 1.0000 mg | INTRAMUSCULAR | Status: DC | PRN

## 2016-02-28 MED ORDER — IBUPROFEN 600 MG PO TABS
600.0000 mg | ORAL_TABLET | Freq: Four times a day (QID) | ORAL | Status: DC
  Administered 2016-02-28 (×3): 600 mg via ORAL
  Filled 2016-02-28 (×3): qty 1

## 2016-02-28 MED ORDER — ZOLPIDEM TARTRATE 5 MG PO TABS: 5.0000 mg | ORAL_TABLET | Freq: Every evening | ORAL | Status: DC | PRN

## 2016-02-28 MED ORDER — OXYTOCIN 40 UNITS IN LACTATED RINGERS INFUSION - SIMPLE MED
INTRAVENOUS | Status: AC
  Filled 2016-02-28: qty 1000

## 2016-02-28 MED ORDER — SODIUM CHLORIDE FLUSH 0.9 % IV SOLN
INTRAVENOUS | Status: AC
  Filled 2016-02-28: qty 20

## 2016-02-28 MED ORDER — WITCH HAZEL-GLYCERIN EX PADS: 1.0000 "application " | MEDICATED_PAD | CUTANEOUS | Status: DC | PRN

## 2016-02-28 MED ORDER — AMMONIA AROMATIC IN INHA
RESPIRATORY_TRACT | Status: AC
  Filled 2016-02-28: qty 10

## 2016-02-28 MED ORDER — LACTATED RINGERS IV SOLN: 500.0000 mL | INTRAVENOUS | Status: DC | PRN

## 2016-02-28 MED ORDER — DIBUCAINE 1 % RE OINT: 1.0000 "application " | TOPICAL_OINTMENT | RECTAL | Status: DC | PRN

## 2016-02-28 MED ORDER — ONDANSETRON HCL 4 MG/2ML IJ SOLN: 4.0000 mg | Freq: Four times a day (QID) | INTRAMUSCULAR | Status: DC | PRN

## 2016-02-28 MED ORDER — OXYTOCIN BOLUS FROM INFUSION: 500.0000 mL | Freq: Once | INTRAVENOUS | Status: DC

## 2016-02-28 MED ORDER — SODIUM CHLORIDE 0.9 % IV SOLN
2.0000 g | Freq: Once | INTRAVENOUS | Status: AC
  Administered 2016-02-28: 2 g via INTRAVENOUS

## 2016-02-28 MED ORDER — ACETAMINOPHEN 325 MG PO TABS: 650.0000 mg | ORAL_TABLET | ORAL | Status: DC | PRN

## 2016-02-28 MED ORDER — SODIUM CHLORIDE 0.9 % IV SOLN: 250.0000 mL | INTRAVENOUS | Status: DC | PRN

## 2016-02-28 MED ORDER — OXYTOCIN 40 UNITS IN LACTATED RINGERS INFUSION - SIMPLE MED: 2.5000 [IU]/h | INTRAVENOUS | Status: DC

## 2016-02-28 MED ORDER — SODIUM CHLORIDE 0.9% FLUSH: 3.0000 mL | Freq: Two times a day (BID) | INTRAVENOUS | Status: DC

## 2016-02-28 MED ORDER — DIPHENHYDRAMINE HCL 25 MG PO CAPS: 25.0000 mg | ORAL_CAPSULE | Freq: Four times a day (QID) | ORAL | Status: DC | PRN

## 2016-02-28 MED ORDER — SODIUM CHLORIDE 0.9% FLUSH: 3.0000 mL | INTRAVENOUS | Status: DC | PRN

## 2016-02-28 MED ORDER — OXYTOCIN 10 UNIT/ML IJ SOLN
INTRAMUSCULAR | Status: AC
  Filled 2016-02-28: qty 2

## 2016-02-28 MED ORDER — ONDANSETRON HCL 4 MG/2ML IJ SOLN: 4.0000 mg | INTRAMUSCULAR | Status: DC | PRN

## 2016-02-28 MED ORDER — SIMETHICONE 80 MG PO CHEW: 80.0000 mg | CHEWABLE_TABLET | ORAL | Status: DC | PRN

## 2016-02-28 MED ORDER — KETOROLAC TROMETHAMINE 30 MG/ML IJ SOLN
30.0000 mg | Freq: Once | INTRAMUSCULAR | Status: AC
  Administered 2016-02-28: 30 mg via INTRAVENOUS

## 2016-02-28 MED ORDER — ONDANSETRON HCL 4 MG PO TABS: 4.0000 mg | ORAL_TABLET | ORAL | Status: DC | PRN

## 2016-02-28 MED ORDER — LIDOCAINE HCL (PF) 1 % IJ SOLN
INTRAMUSCULAR | Status: AC
  Filled 2016-02-28: qty 30

## 2016-02-28 MED ORDER — SENNOSIDES-DOCUSATE SODIUM 8.6-50 MG PO TABS
2.0000 | ORAL_TABLET | ORAL | Status: DC
  Administered 2016-02-29: 2 via ORAL
  Filled 2016-02-28 (×3): qty 2

## 2016-02-28 NOTE — OB Triage Note (Signed)
Recvd pt from ED. C/O contractions every 4-5 min "maybe even closer" and states she is feeling baby move ok. Changed into gown and EFM applied.

## 2016-02-28 NOTE — Discharge Summary (Signed)
Obstetrical Discharge Summary  Date of Admission: 02/28/2016 Date of Discharge: 03/01/16   Discharge Diagnosis: Term Pregnancy-delivered and Precipitous delivery and No Prenatal Care Primary OB:  Other none   Gestational Age at Delivery: 4756w1d  Antepartum complications: No prenatal care Date of Delivery: 03/01/2016  Delivered By: Annamarie MajorPaul Harris, MD Delivery Type: spontaneous vaginal delivery Intrapartum complications/course: None Anesthesia: none Placenta: spontaneous Laceration: none Episiotomy: none Live born female  Birth Weight:  6-5 APGAR: 8, 9   Post partum course: Since the delivery, patient has tolerate activity, diet, and daily functions without difficulty or complication.  Min lochia.  No breast concerns at this time.  No signs of depression currently.   Postpartum Exam:General appearance: alert, appears older than stated age and no distress GI: soft, non-tender; bowel sounds normal; no masses,  no organomegaly and Fundus firm Extremities: extremities normal, atraumatic, no cyanosis or edema  Disposition: home without infant (adoption) Rh Immune globulin given: not applicable Rubella vaccine given: no Varicella vaccine given: no Tdap vaccine given in AP or PP setting: no Flu vaccine given in AP or PP setting: no Contraception: IUD  Prenatal Labs: O POS//Rubella Immune//RPR negative//HIV negative/HepB Surface Ag negative  Plan:  Carolyn [name] was discharged to home in good condition. [facility]  Discharge Medications:    Medication List    STOP taking these medications   acetaminophen 500 MG tablet Commonly known as:  TYLENOL   benzonatate 100 MG capsule Commonly known as:  TESSALON PERLES   chlorpheniramine-HYDROcodone 10-8 MG/5ML Suer Commonly known as:  TUSSIONEX PENNKINETIC ER   doxycycline 100 MG tablet Commonly known as:  VIBRA-TABS   HYDROcodone-acetaminophen 5-325 MG tablet Commonly known as:  NORCO    levofloxacin 750 MG tablet Commonly known as:  LEVAQUIN     TAKE these medications   ferrous sulfate 325 (65 FE) MG tablet Commonly known as:  FERROUSUL Take 1 tablet (325 mg total) by mouth 2 (two) times daily.   ibuprofen 600 MG tablet Commonly known as:  ADVIL,MOTRIN Take 1 tablet (600 mg total) by mouth every 6 (six) hours as needed for mild pain or moderate pain. Reported on 02/01/2016 What changed:  medication strength  how much to take  reasons to take this   prenatal multivitamin Tabs tablet Take 1 tablet by mouth daily at 12 noon.        Follow-up arrangements:  6 weeks   No future appointments.
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
## 2016-02-28 NOTE — Progress Notes (Signed)
Pt. Tolerated regular diet without emesis. IV infusing at 62.5 ml/hr (LR 1L with 40U Pitocin). Stable patient.

## 2016-02-28 NOTE — H&P (Signed)
Obstetric History and Physical  Carolyn [name] is a 20 y.o. G2P1001 with Estimated Date of Delivery: 02/27/16 per 36 wk US who presents at 2325w1d  presenting for contractions. Pt initially reported pain of 9/10. Patient states she has been having regular contractions, no vaginal bleeding, intact membranes, with active fetal movement.    Prenatal Course Source of Care: [facility]  with onset of care at 37 weeks Pregnancy complications or risks: Late to care  Plans adoption H/o GDM and pre-eclampsia with G1  Patient Active Problem List   Diagnosis Date Noted  . Irregular contractions 02/07/2016    Prenatal labs and studies: ABO, Rh: O+  Antibody: Neg Rubella: Immune Varicella: Immune RPR:  NR HBsAg:  Neg HIV: Neg GC/CT: Neg/Neg GBS: positive 1 hr Glucola: random glucoses normal  Prenatal Transfer Tool   Past Medical History:  Diagnosis Date  . Allergy   . Asthma   . Depression   . Pneumonia    PSH-  History reviewed. No pertinent surgical history.  OB History  Gravida Para Term Preterm AB Living  2 1 1     1   SAB TAB Ectopic Multiple Live Births        0 1    # Outcome Date GA Lbr Len/2nd Weight Sex Delivery Anes PTL Lv  2 Current           1 Term 12/30/14 6281w5d / 00:17 6 lb 7 oz (2.92 kg) M Vag-Spont   LIV     Medications: none  Social History   Social History  . Marital status: Single    Spouse name: N/A  . Number of children: N/A  . Years of education: N/A   Social History Main Topics  . Smoking status: Never Smoker  . Smokeless tobacco: Never Used  . Alcohol use No  . Drug use: No  . Sexual activity: Not Asked   Other Topics Concern  . None   Social History Narrative  . None    Family History  Problem Relation Age of Onset  . Hypertension Father     Allergies  Allergen Reactions  . Advair Diskus [Fluticasone-Salmeterol] Other (See Comments)    Pt states she "blacked out"  . Sulfa Antibiotics Other (See Comments)    Infant  . Prednisone     Made her bonkers  . Banana Itching    Review of Systems: Negative except for what is mentioned in HPI.  Physical Exam: LMP 05/10/2015  GENERAL: Well-developed, well-nourished female in no acute distress.  LUNGS: NO increased work of breathing ABDOMEN:Tender, nondistended, gravid. EXTREMITIES: Nontender, no edema Cervical Exam: Dilatation 9 cm   Effacement 90 %   Station -2   Presentation: cephalic FHT: Category: 1 Baseline rate 130 bpm   Variability moderate  Accelerations present   Decelerations none Contractions: Every 2-3 mins  Assessment : IUP at 40 weeks, active labor  Plan: Admit w impending delivery Ampicillin one dose to be given with liklihood unable to get 2 doses 4 hours apart in Analgesia as best we can, avoid narcotics, no time for epidural FWB reassuring  Annamarie MajorPaul Harris, MD

## 2016-02-28 NOTE — Progress Notes (Signed)
RN to the bedside to assist patient up to BR for post delivery void.  Pt. Void large amount with 2 mod. Size clots with mod bleeding.  Pericare given, pt. Color pink, pt. States, she's feeling fine.  BP stable. Pt[facility]  Pt. Verbalized desire to turn baby over to DSS, pt. Verbalized desire to give baby up for adoption, no adoptive parents..baby to be turned over to DSS.  Per report, hospital SW notified. Pt. Informed..she can see baby at any time while here in the hospital. Pt. Verbalized understanding. Baby currently in Nursery.

## 2016-02-29 LAB — CBC
HEMATOCRIT: 30.5 % — AB (ref 35.0–47.0)
HEMOGLOBIN: 9.6 g/dL — AB (ref 12.0–16.0)
MCH: 23.1 pg — ABNORMAL LOW (ref 26.0–34.0)
MCHC: 31.3 g/dL — AB (ref 32.0–36.0)
MCV: 73.6 fL — ABNORMAL LOW (ref 80.0–100.0)
Platelets: 177 10*3/uL (ref 150–440)
RBC: 4.15 MIL/uL (ref 3.80–5.20)
RDW: 18.2 % — ABNORMAL HIGH (ref 11.5–14.5)
WBC: 10.3 10*3/uL (ref 3.6–11.0)

## 2016-02-29 LAB — RPR: RPR Ser Ql: NONREACTIVE

## 2016-02-29 MED ORDER — IBUPROFEN 600 MG PO TABS
600.0000 mg | ORAL_TABLET | Freq: Four times a day (QID) | ORAL | Status: DC
  Administered 2016-02-29 – 2016-03-01 (×5): 600 mg via ORAL
  Filled 2016-02-29 (×5): qty 1

## 2016-02-29 MED ORDER — IBUPROFEN 600 MG PO TABS
600.0000 mg | ORAL_TABLET | Freq: Four times a day (QID) | ORAL | Status: DC
  Administered 2016-02-29: 600 mg via ORAL
  Filled 2016-02-29: qty 1

## 2016-02-29 NOTE — Clinical Social Work Note (Signed)
Lakeisha with [facility] has completed adoption paperwork with patient's mother this morning. This paperwork has been placed on patient's chart. Patient expected to discharge tomorrow and Lakeisha is aware and will be here tomorrow to pick up patient when time. Patient's mother to discharge today. Lakeisha can be reached if needed at:  Tamicka Shimon MSW,LCSW [phone] 

## 2016-02-29 NOTE — Progress Notes (Signed)
Obstetric Postpartum Daily Progress Note Subjective:  20 y.o. [id number] postpartum day #1 status post vaginal delivery.  She is ambulating, is tolerating po, is voiding spontaneously.  Her pain is well controlled on PO pain medications. Her lochia is less than menses.   Medications SCHEDULED MEDICATIONS  . ibuprofen  600 mg Oral Q6H  . senna-docusate  2 tablet Oral Q24H    MEDICATION INFUSIONS     PRN MEDICATIONS  acetaminophen, benzocaine-Menthol, coconut oil, witch hazel-glycerin **AND** dibucaine, diphenhydrAMINE, ondansetron **OR** ondansetron (ZOFRAN) IV, oxyCODONE-acetaminophen, simethicone, zolpidem    Objective:   Vitals:   02/28/16 2339 02/28/16 2340 02/29/16 0341 02/29/16 0836  BP: (!) 132/92 124/81 (!) 128/94 111/69  Pulse: 75 67 68 67  Resp: 17  17 18   Temp: 97.8 F (36.6 C)  97.9 F (36.6 C) 98.2 F (36.8 C)  TempSrc: Oral  Oral Tympanic  SpO2: 100%  100% 99%  Weight:      Height:        Current Vital Signs 24h Vital Sign Ranges  T 98.2 F (36.8 C) Temp  Avg: 97.9 F (36.6 C)  Min: 97.1 F (36.2 C)  Max: 98.4 F (36.9 C)  BP 111/69 BP  Min: 109/99  Max: 132/92  HR 67 Pulse  Avg: 70.6  Min: 67  Max: 75  RR 18 Resp  Avg: 18  Min: 17  Max: 20  SaO2 99 % Not Delivered SpO2  Avg: 99.8 %  Min: 99 %  Max: 100 %       24 Hour I/O Current Shift I/O  Time Ins Outs 08/17 0701 - 08/18 0700 In: 240 [P.O.:240] Out: 500 [Urine:500] No intake/output data recorded.  General: NAD Pulmonary: no increased work of breathing Abdomen: non-distended, non-tender, fundus firm at level of umbilicus Extremities: no edema, no erythema, no tenderness  Labs:   Recent Labs Lab 02/28/16 0530 02/29/16 0416  WBC 8.7 10.3  HGB 10.8* 9.6*  HCT 33.7* 30.5*  PLT 202 177     Assessment:   20 y.o. G2P2002 postpartum day # 1 status post SVD doing well, Baby up for adoption. Social work and care management involved.   Plan:   1) Acute blood loss anemia - hemodynamically stable  and asymptomatic - po ferrous sulfate  2) O POS / Rubella 1.16 (07/21 0215)/ Varicella Immune  3) TDAP status did not receive (give prior to discharge)  4) Contraception = needs to decide.  Is considering IUD. recommend she follow up soon to arrange this and to continue to get medicaid coverage in order to assist her with reproductive choices.  5) Disposition: Home tomorrow  Thomasene MohairStephen Blayke Cordrey, MD 02/29/2016 10:47 AM

## 2016-03-01 MED ORDER — MEDROXYPROGESTERONE ACETATE 150 MG/ML IM SUSP
150.0000 mg | INTRAMUSCULAR | Status: DC
  Administered 2016-03-01: 150 mg via INTRAMUSCULAR
  Filled 2016-03-01: qty 1

## 2016-03-01 MED ORDER — IBUPROFEN 600 MG PO TABS: 600.0000 mg | ORAL_TABLET | Freq: Four times a day (QID) | ORAL | 0 refills | Status: DC | PRN

## 2016-03-01 MED ORDER — FERROUS SULFATE 325 (65 FE) MG PO TABS: 325.0000 mg | ORAL_TABLET | Freq: Two times a day (BID) | ORAL | 1 refills | Status: DC

## 2016-03-01 NOTE — Progress Notes (Signed)
Discharge instructions provided.  Pt verbalizes understanding of all instructions and follow-up care.  Prescriptions given.  Pt discharged to home at 1645 on 03/01/16 via wheelchair by CNA. Reynold BowenSusan Paisley Prima Rayner, RN 03/01/2016 5:13 PM

## 2016-03-01 NOTE — Progress Notes (Signed)
Pt states that she received TDaP vaccine during pregnancy.  Pt declines TDaP vaccine at this time. Reynold BowenSusan Paisley Sylvi Rybolt, RN 03/01/2016 12:06 PM

## 2016-03-01 NOTE — Discharge Instructions (Signed)
Vaginal Delivery, Care After °Refer to this sheet in the next few weeks. These discharge instructions provide you with information on caring for yourself after delivery. Your health care provider may also give you specific instructions. Your treatment has been planned according to the most current medical practices available, but problems sometimes occur. Call your health care provider if you have any problems or questions after you go home. °HOME CARE INSTRUCTIONS °· Take over-the-counter or prescription medicines only as directed by your health care provider or pharmacist. °· Do not drink alcohol, especially if you are breastfeeding or taking medicine to relieve pain. °· Do not chew or smoke tobacco. °· Do not use illegal drugs. °· Continue to use good perineal care. Good perineal care includes: °¨ Wiping your perineum from front to back. °¨ Keeping your perineum clean. °· Do not use tampons or douche until your health care provider says it is okay. °· Shower, wash your hair, and take tub baths as directed by your health care provider. °· Wear a well-fitting bra that provides breast support. °· Eat healthy foods. °· Drink enough fluids to keep your urine clear or pale yellow. °· Eat high-fiber foods such as whole grain cereals and breads, brown rice, beans, and fresh fruits and vegetables every day. These foods may help prevent or relieve constipation. °· Follow your health care provider's recommendations regarding resumption of activities such as climbing stairs, driving, lifting, exercising, or traveling. °· Talk to your health care provider about resuming sexual activities. Resumption of sexual activities is dependent upon your risk of infection, your rate of healing, and your comfort and desire to resume sexual activity. °· Try to have someone help you with your household activities and your newborn for at least a few days after you leave the hospital. °· Rest as much as possible. Try to rest or take a nap  when your newborn is sleeping. °· Increase your activities gradually. °· Keep all of your scheduled postpartum appointments. It is very important to keep your scheduled follow-up appointments. At these appointments, your health care provider will be checking to make sure that you are healing physically and emotionally. °SEEK MEDICAL CARE IF:  °· You are passing large clots from your vagina. Save any clots to show your health care provider. °· You have a foul smelling discharge from your vagina. °· You have trouble urinating. °· You are urinating frequently. °· You have pain when you urinate. °· You have a change in your bowel movements. °· You have increasing redness, pain, or swelling near your vaginal incision (episiotomy) or vaginal tear. °· You have pus draining from your episiotomy or vaginal tear. °· Your episiotomy or vaginal tear is separating. °· You have painful, hard, or reddened breasts. °· You have a severe headache. °· You have blurred vision or see spots. °· You feel sad or depressed. °· You have thoughts of hurting yourself or your newborn. °· You have questions about your care, the care of your newborn, or medicines. °· You are dizzy or light-headed. °· You have a rash. °· You have nausea or vomiting. °· You were breastfeeding and have not had a menstrual period within 12 weeks after you stopped breastfeeding. °· You are not breastfeeding and have not had a menstrual period by the 12th week after delivery. °· You have a fever. °SEEK IMMEDIATE MEDICAL CARE IF:  °· You have persistent pain. °· You have chest pain. °· You have shortness of breath. °· You faint. °· You   have leg pain.  You have stomach pain.  Your vaginal bleeding saturates two or more sanitary pads in 1 hour.   This information is not intended to replace advice given to you by your health care provider. Make sure you discuss any questions you have with your health care provider.   Document Released: 06/27/2000 Document Revised:  03/21/2015 Document Reviewed: 02/25/2012 Elsevier Interactive Patient Education 2016 [employer]  Call your doctor for increased pain or vaginal bleeding, temperature above 100.4, depression, or concerns.  No strenuous activity or heavy lifting for 6 weeks.  No intercourse, tampons, douching, or enemas for 6 weeks.  No tub baths-showers only.  No driving for 2 weeks or while taking pain medications.  Continue prenatal vitamin and iron.

## 2016-05-06 ENCOUNTER — Encounter: Payer: Self-pay | Admitting: Emergency Medicine

## 2016-05-06 DIAGNOSIS — J45909 Unspecified asthma, uncomplicated: Secondary | ICD-10-CM | POA: Insufficient documentation

## 2016-05-06 DIAGNOSIS — N3000 Acute cystitis without hematuria: Secondary | ICD-10-CM | POA: Insufficient documentation

## 2016-05-06 DIAGNOSIS — Z79899 Other long term (current) drug therapy: Secondary | ICD-10-CM | POA: Insufficient documentation

## 2016-05-06 LAB — POCT PREGNANCY, URINE: PREG TEST UR: NEGATIVE

## 2016-05-06 NOTE — ED Triage Notes (Signed)
Pt in with co urinary frequency and pain for 2 days co supra pubic pain.

## 2016-05-07 ENCOUNTER — Emergency Department
Admission: EM | Admit: 2016-05-07 | Discharge: 2016-05-07 | Disposition: A | Discharge status: Home / Self Care | Payer: Self-pay | Attending: Emergency Medicine | Admitting: Emergency Medicine

## 2016-05-07 DIAGNOSIS — N3000 Acute cystitis without hematuria: Secondary | ICD-10-CM

## 2016-05-07 LAB — URINALYSIS COMPLETE WITH MICROSCOPIC (ARMC ONLY)
BILIRUBIN URINE: NEGATIVE
Glucose, UA: NEGATIVE mg/dL
KETONES UR: NEGATIVE mg/dL
Nitrite: POSITIVE — AB
PH: 6 (ref 5.0–8.0)
PROTEIN: 100 mg/dL — AB
SPECIFIC GRAVITY, URINE: 1.013 (ref 1.005–1.030)

## 2016-05-07 MED ORDER — CEPHALEXIN 500 MG PO CAPS: 500.0000 mg | ORAL_CAPSULE | Freq: Two times a day (BID) | ORAL | 0 refills | Status: AC

## 2016-05-07 MED ORDER — CEPHALEXIN 500 MG PO CAPS
500.0000 mg | ORAL_CAPSULE | Freq: Once | ORAL | Status: AC
  Administered 2016-05-07: 500 mg via ORAL
  Filled 2016-05-07: qty 1

## 2016-05-07 NOTE — ED Notes (Signed)
Discharge instructions reviewed with patient. Patient verbalized understanding. Patient ambulated to lobby without difficulty.   

## 2016-05-07 NOTE — ED Provider Notes (Signed)
[facility] Emergency Department Provider Note   First MD Initiated Contact with Patient 05/07/16 0030     (approximate)  I have reviewed the triage vital signs and the nursing notes.   HISTORY  Chief Complaint Dysuria    HPI Carolyn [name] is a 20 y.o. female resents with 2 day history of urinary frequency urgency and dysuria. Patient denies any fever afebrile on presentation with a temperature 98.8. Patient states that symptoms are consistent with previous urinary tract infection. Patient denies any vaginal discharge   Past Medical History:  Diagnosis Date  . Allergy   . Asthma   . Depression   . Pneumonia     Patient Active Problem List   Diagnosis Date Noted  . Normal labor and delivery 02/28/2016  . Irregular contractions 02/07/2016  . Hypertension in pregnancy, preeclampsia, severe, delivered 01/01/2015  . Postpartum care following vaginal delivery 12/31/2014  . Labor and delivery, indication for care 12/30/2014  . Abdominal pain 12/29/2014  . Labor and delivery indication for care or intervention 12/29/2014  . Braxton Hicks contractions 12/05/2014    No past surgical history on file.  Prior to Admission medications   Medication Sig Start Date End Date Taking? Authorizing Provider  ferrous sulfate (FERROUSUL) 325 (65 FE) MG tablet Take 1 tablet (325 mg total) by mouth 2 (two) times daily. 03/01/16   Vena Austria, MD  ibuprofen (ADVIL,MOTRIN) 600 MG tablet Take 1 tablet (600 mg total) by mouth every 6 (six) hours as needed for mild pain or moderate pain. Reported on 02/01/2016 03/01/16   Vena Austria, MD  Prenatal Vit-Fe Fumarate-FA (PRENATAL MULTIVITAMIN) TABS tablet Take 1 tablet by mouth daily at 12 noon.    Historical Provider, MD    Allergies Advair diskus [fluticasone-salmeterol]; Sulfa antibiotics; Prednisone; and Banana  Family History  Problem Relation Age of Onset  . Hypertension Father     Social History Social  History  Substance Use Topics  . Smoking status: Never Smoker  . Smokeless tobacco: Never Used  . Alcohol use No    Review of Systems Constitutional: No fever/chills Eyes: No visual changes. ENT: No sore throat. Cardiovascular: Denies chest pain. Respiratory: Denies shortness of breath. Gastrointestinal: No abdominal pain.  No nausea, no vomiting.  No diarrhea.  No constipation. Genitourinary: Positive for urinary frequency urgency and dysuria Musculoskeletal: Negative for back pain. Skin: Negative for rash. Neurological: Negative for headaches, focal weakness or numbness.  10-point ROS otherwise negative.  ____________________________________________   PHYSICAL EXAM:  VITAL SIGNS: ED Triage Vitals  Enc Vitals Group     BP 05/06/16 2341 132/82     Pulse Rate 05/06/16 2341 (!) 104     Resp 05/06/16 2341 18     Temp 05/06/16 2341 98.8 F (37.1 C)     Temp Source 05/06/16 2341 Oral     SpO2 05/06/16 2341 98 %     Weight 05/06/16 2342 120 lb (54.4 kg)     Height 05/06/16 2342 4\' 11"  (1.499 m)     Head Circumference --      Peak Flow --      Pain Score 05/06/16 2342 7     Pain Loc --      Pain Edu? --      Excl. in GC? --     Constitutional: Alert and oriented. Well appearing and in no acute distress. Eyes: Conjunctivae are normal. PERRL. EOMI. Head: Atraumatic. Mouth/Throat: Mucous membranes are moist.  Oropharynx non-erythematous. Cardiovascular: Normal  rate, regular rhythm. Good peripheral circulation. Grossly normal heart sounds. Respiratory: Normal respiratory effort.  No retractions. Lungs CTAB. Gastrointestinal: Soft and nontender. No distention.  Musculoskeletal: No lower extremity tenderness nor edema. No gross deformities of extremities. Neurologic:  Normal speech and language. No gross focal neurologic deficits are appreciated.  Skin:  Skin is warm, dry and intact. No rash noted. Psychiatric: Mood and affect are normal. Speech and behavior are  normal.  ____________________________________________   LABS (all labs ordered are listed, but only abnormal results are displayed)  Labs Reviewed  URINALYSIS COMPLETEWITH MICROSCOPIC ([facility] ONLY) - Abnormal; Notable for the following:       Result Value   Color, Urine AMBER (*)    APPearance CLOUDY (*)    Hgb urine dipstick 2+ (*)    Protein, ur 100 (*)    Nitrite POSITIVE (*)    Leukocytes, UA 2+ (*)    Bacteria, UA FEW (*)    Squamous Epithelial / LPF 0-5 (*)    All other components within normal limits  POC URINE PREG, ED  POCT PREGNANCY, URINE       Procedures     INITIAL IMPRESSION / ASSESSMENT AND PLAN / ED COURSE  Pertinent labs & imaging results that were available during my care of the patient were reviewed by me and considered in my medical decision making (see chart for details).  History physical exam consistent with urinary tract infection as such patient given Keflex and will be prescribed same at home.   Clinical Course    ____________________________________________  FINAL CLINICAL IMPRESSION(S) / ED DIAGNOSES  Final diagnoses:  Acute cystitis without hematuria     MEDICATIONS GIVEN DURING THIS VISIT:  Medications  cephALEXin (KEFLEX) capsule 500 mg (not administered)     NEW OUTPATIENT MEDICATIONS STARTED DURING THIS VISIT:  New Prescriptions   No medications on file    Modified Medications   No medications on file    Discontinued Medications   No medications on file     Note:  This document was prepared using Dragon voice recognition software and may include unintentional dictation errors.    Darci Currentandolph N Brown, MD 05/07/16 [phone]

## 2016-09-05 ENCOUNTER — Emergency Department: Payer: Self-pay

## 2016-09-05 ENCOUNTER — Encounter: Payer: Self-pay | Admitting: Emergency Medicine

## 2016-09-05 ENCOUNTER — Emergency Department
Admission: EM | Admit: 2016-09-05 | Discharge: 2016-09-05 | Disposition: A | Discharge status: Home / Self Care | Payer: Self-pay | Attending: Emergency Medicine | Admitting: Emergency Medicine

## 2016-09-05 DIAGNOSIS — F419 Anxiety disorder, unspecified: Secondary | ICD-10-CM | POA: Insufficient documentation

## 2016-09-05 DIAGNOSIS — J9801 Acute bronchospasm: Secondary | ICD-10-CM | POA: Insufficient documentation

## 2016-09-05 DIAGNOSIS — Z791 Long term (current) use of non-steroidal anti-inflammatories (NSAID): Secondary | ICD-10-CM | POA: Insufficient documentation

## 2016-09-05 DIAGNOSIS — Z79899 Other long term (current) drug therapy: Secondary | ICD-10-CM | POA: Insufficient documentation

## 2016-09-05 LAB — CBC
HEMATOCRIT: 38.2 % (ref 35.0–47.0)
HEMOGLOBIN: 12.5 g/dL (ref 12.0–16.0)
MCH: 25.2 pg — AB (ref 26.0–34.0)
MCHC: 32.7 g/dL (ref 32.0–36.0)
MCV: 77 fL — AB (ref 80.0–100.0)
Platelets: 277 10*3/uL (ref 150–440)
RBC: 4.96 MIL/uL (ref 3.80–5.20)
RDW: 17.6 % — ABNORMAL HIGH (ref 11.5–14.5)
WBC: 5.3 10*3/uL (ref 3.6–11.0)

## 2016-09-05 LAB — BASIC METABOLIC PANEL
ANION GAP: 7 (ref 5–15)
BUN: 9 mg/dL (ref 6–20)
CO2: 27 mmol/L (ref 22–32)
Calcium: 9.4 mg/dL (ref 8.9–10.3)
Chloride: 105 mmol/L (ref 101–111)
Creatinine, Ser: 0.53 mg/dL (ref 0.44–1.00)
GFR calc Af Amer: >60 mL/min (ref >60)
GFR calc non Af Amer: >60 mL/min (ref >60)
GLUCOSE: 102 mg/dL — AB (ref 65–99)
POTASSIUM: 3.3 mmol/L — AB (ref 3.5–5.1)
Sodium: 139 mmol/L (ref 135–145)

## 2016-09-05 LAB — TROPONIN I: Troponin I: <0.03 ng/mL (ref <0.03)

## 2016-09-05 MED ORDER — IPRATROPIUM-ALBUTEROL 0.5-2.5 (3) MG/3ML IN SOLN
3.0000 mL | Freq: Once | RESPIRATORY_TRACT | Status: AC
  Administered 2016-09-05: 3 mL via RESPIRATORY_TRACT

## 2016-09-05 MED ORDER — IPRATROPIUM-ALBUTEROL 0.5-2.5 (3) MG/3ML IN SOLN
RESPIRATORY_TRACT | Status: AC
  Administered 2016-09-05: 3 mL via RESPIRATORY_TRACT
  Filled 2016-09-05: qty 6

## 2016-09-05 MED ORDER — ALBUTEROL SULFATE HFA 108 (90 BASE) MCG/ACT IN AERS: 2.0000 | INHALATION_SPRAY | Freq: Four times a day (QID) | RESPIRATORY_TRACT | 2 refills | Status: DC | PRN

## 2016-09-05 NOTE — ED Provider Notes (Signed)
[facility] Emergency Department Provider Note   ____________________________________________    I have reviewed the triage vital signs and the nursing notes.   HISTORY  Chief Complaint Chest Pain     HPI Carolyn [name] is a 21 y.o. female who presents with complaints of chest discomfort. She describes a tightness in the front of her chest and mild soreness of breath. She does report history of asthma. She denies cough or fevers. No recent travel. No calf pain or swelling. She also reports anxiety and being alarmed at loud noises. She reports she is homeless and has been moving from hotel to motel with her boyfriend. She is here with her parents who strongly want her to move in with them again but she will not because she doesn't want to lose her boyfriend.   Past Medical History:  Diagnosis Date  . Allergy   . Asthma   . Depression   . Pneumonia     Patient Active Problem List   Diagnosis Date Noted  . Normal labor and delivery 02/28/2016  . Irregular contractions 02/07/2016  . Hypertension in pregnancy, preeclampsia, severe, delivered 01/01/2015  . Postpartum care following vaginal delivery 12/31/2014  . Labor and delivery, indication for care 12/30/2014  . Abdominal pain 12/29/2014  . Labor and delivery indication for care or intervention 12/29/2014  . Braxton Hicks contractions 12/05/2014    History reviewed. No pertinent surgical history.  Prior to Admission medications   Medication Sig Start Date End Date Taking? Authorizing Provider  albuterol (PROVENTIL HFA;VENTOLIN HFA) 108 (90 Base) MCG/ACT inhaler Inhale 2 puffs into the lungs every 6 (six) hours as needed for wheezing or shortness of breath. 09/05/16   Jene Everyobert Sophiarose Eades, MD  ferrous sulfate (FERROUSUL) 325 (65 FE) MG tablet Take 1 tablet (325 mg total) by mouth 2 (two) times daily. 03/01/16   Vena AustriaAndreas Staebler, MD  ibuprofen (ADVIL,MOTRIN) 600 MG tablet Take 1 tablet (600 mg total)  by mouth every 6 (six) hours as needed for mild pain or moderate pain. Reported on 02/01/2016 03/01/16   Vena AustriaAndreas Staebler, MD  Prenatal Vit-Fe Fumarate-FA (PRENATAL MULTIVITAMIN) TABS tablet Take 1 tablet by mouth daily at 12 noon.    Historical Provider, MD     Allergies Advair diskus [fluticasone-salmeterol]; Sulfa antibiotics; Prednisone; and Banana  Family History  Problem Relation Age of Onset  . Hypertension Father     Social History Social History  Substance Use Topics  . Smoking status: Never Smoker  . Smokeless tobacco: Never Used  . Alcohol use No    Review of Systems  Constitutional: No fever/chills Eyes: No visual changes.  ENT: No sore throat. Cardiovascular: As above Respiratory: As above Gastrointestinal: No abdominal pain. Genitourinary: Negative for dysuria. Musculoskeletal: Negative for back pain. Skin: Negative for rash. Neurological: Negative for headaches   10-point ROS otherwise negative.  ____________________________________________   PHYSICAL EXAM:  VITAL SIGNS: ED Triage Vitals  Enc Vitals Group     BP 09/05/16 1644 (!) 129/97     Pulse Rate 09/05/16 1644 (!) 119     Resp 09/05/16 1644 18     Temp 09/05/16 1644 98 F (36.7 C)     Temp Source 09/05/16 1644 Oral     SpO2 09/05/16 1644 100 %     Weight 09/05/16 1645 100 lb (45.4 kg)     Height 09/05/16 1645 4\' 11"  (1.499 m)     Head Circumference --      Peak Flow --  Pain Score 09/05/16 1645 6     Pain Loc --      Pain Edu? --      Excl. in GC? --     Constitutional: Alert and oriented. No acute distress. Pleasant and interactive Eyes: Conjunctivae are normal.   Nose: No congestion/rhinnorhea. Mouth/Throat: Mucous membranes are moist.   Neck:  Painless ROM Cardiovascular: Tachycardia, heart rate 105, regular rhythm. Grossly normal heart sounds.  Good peripheral circulation. Respiratory: Normal respiratory effort.  No retractions. I basal or wheezes Gastrointestinal: Soft and  nontender. No distention.  No CVA tenderness. Genitourinary: deferred Musculoskeletal: No lower extremity tenderness nor edema.  Warm and well perfused Neurologic:  Normal speech and language. No gross focal neurologic deficits are appreciated.  Skin:  Skin is warm, dry and intact. No rash noted. Psychiatric: Mood and affect are normal. Speech and behavior are normal.  ____________________________________________   LABS (all labs ordered are listed, but only abnormal results are displayed)  Labs Reviewed  BASIC METABOLIC PANEL - Abnormal; Notable for the following:       Result Value   Potassium 3.3 (*)    Glucose, Bld 102 (*)    All other components within normal limits  CBC - Abnormal; Notable for the following:    MCV 77.0 (*)    MCH 25.2 (*)    RDW 17.6 (*)    All other components within normal limits  TROPONIN I   ____________________________________________  EKG  ED ECG REPORT I, Jene Every, the attending physician, personally viewed and interpreted this ECG.  Date: 09/05/2016  Rate: 123 Rhythm: Sinus tachycardia QRS Axis: normal Intervals: normal ST/T Wave abnormalities: normal Conduction Disturbances: none Narrative Interpretation: unremarkable  ____________________________________________  RADIOLOGY  Chest x-ray unremarkable ____________________________________________   PROCEDURES  Procedure(s) performed: No    Critical Care performed: No ____________________________________________   INITIAL IMPRESSION / ASSESSMENT AND PLAN / ED COURSE  Pertinent labs & imaging results that were available during my care of the patient were reviewed by me and considered in my medical decision making (see chart for details).  Patient with bibasilar wheezing, suspect asthma exacerbation as the cause of her chest tightness and discomfort. Treated with DuoNeb 2 with complete relief of her symptoms. She remained mildly tachycardic after treatment likely  because of the albuterol. Also had a long discussion with she and her parents regarding outpatient psychiatric follow-up for anxiety, I will refer her to [facility]    ____________________________________________   FINAL CLINICAL IMPRESSION(S) / ED DIAGNOSES  Final diagnoses:  Bronchospasm  Anxiety      NEW MEDICATIONS STARTED DURING THIS VISIT:  Discharge Medication List as of 09/05/2016  6:54 PM    START taking these medications   Details  albuterol (PROVENTIL HFA;VENTOLIN HFA) 108 (90 Base) MCG/ACT inhaler Inhale 2 puffs into the lungs every 6 (six) hours as needed for wheezing or shortness of breath., Starting Fri 09/05/2016, Print         Note:  This document was prepared using Dragon voice recognition software and may include unintentional dictation errors.    Jene Every, MD 09/05/16 [phone]

## 2016-09-05 NOTE — ED Triage Notes (Addendum)
Pt arrived via POV with grandparents with reports of chest tightness and shortness of breath. Patient has been homeless for a while and states the chest pain as been off and on for several weeks.  Pt states she has a hx of asthma.  Denies any nausea or vomiting. Pt reports weakness

## 2016-09-05 NOTE — ED Notes (Signed)
Patient transported to X-ray 

## 2016-09-07 ENCOUNTER — Encounter: Payer: Self-pay | Admitting: Emergency Medicine

## 2016-09-07 ENCOUNTER — Emergency Department
Admission: EM | Admit: 2016-09-07 | Discharge: 2016-09-07 | Disposition: A | Discharge status: Home / Self Care | Payer: Self-pay | Attending: Emergency Medicine | Admitting: Emergency Medicine

## 2016-09-07 ENCOUNTER — Emergency Department: Payer: Self-pay

## 2016-09-07 DIAGNOSIS — S0083XA Contusion of other part of head, initial encounter: Secondary | ICD-10-CM

## 2016-09-07 DIAGNOSIS — W0110XA Fall on same level from slipping, tripping and stumbling with subsequent striking against unspecified object, initial encounter: Secondary | ICD-10-CM | POA: Insufficient documentation

## 2016-09-07 DIAGNOSIS — Y939 Activity, unspecified: Secondary | ICD-10-CM | POA: Insufficient documentation

## 2016-09-07 DIAGNOSIS — Z79899 Other long term (current) drug therapy: Secondary | ICD-10-CM | POA: Insufficient documentation

## 2016-09-07 DIAGNOSIS — F1510 Other stimulant abuse, uncomplicated: Secondary | ICD-10-CM | POA: Insufficient documentation

## 2016-09-07 DIAGNOSIS — J45909 Unspecified asthma, uncomplicated: Secondary | ICD-10-CM | POA: Insufficient documentation

## 2016-09-07 DIAGNOSIS — Y929 Unspecified place or not applicable: Secondary | ICD-10-CM | POA: Insufficient documentation

## 2016-09-07 DIAGNOSIS — Y999 Unspecified external cause status: Secondary | ICD-10-CM | POA: Insufficient documentation

## 2016-09-07 DIAGNOSIS — S0181XA Laceration without foreign body of other part of head, initial encounter: Secondary | ICD-10-CM | POA: Insufficient documentation

## 2016-09-07 LAB — CK: Total CK: 48 U/L (ref 38–234)

## 2016-09-07 LAB — CBC WITH DIFFERENTIAL/PLATELET
BASOS ABS: 0 10*3/uL (ref 0–0.1)
BASOS PCT: 1 %
EOS PCT: 2 %
Eosinophils Absolute: 0.1 10*3/uL (ref 0–0.7)
HEMATOCRIT: 35.7 % (ref 35.0–47.0)
Hemoglobin: 11.7 g/dL — ABNORMAL LOW (ref 12.0–16.0)
Lymphocytes Relative: 22 %
Lymphs Abs: 1.2 10*3/uL (ref 1.0–3.6)
MCH: 25 pg — ABNORMAL LOW (ref 26.0–34.0)
MCHC: 32.7 g/dL (ref 32.0–36.0)
MCV: 76.5 fL — ABNORMAL LOW (ref 80.0–100.0)
MONO ABS: 0.5 10*3/uL (ref 0.2–0.9)
Monocytes Relative: 9 %
NEUTROS ABS: 3.6 10*3/uL (ref 1.4–6.5)
Neutrophils Relative %: 66 %
PLATELETS: 270 10*3/uL (ref 150–440)
RBC: 4.67 MIL/uL (ref 3.80–5.20)
RDW: 18 % — AB (ref 11.5–14.5)
WBC: 5.4 10*3/uL (ref 3.6–11.0)

## 2016-09-07 LAB — COMPREHENSIVE METABOLIC PANEL
ALBUMIN: 4.5 g/dL (ref 3.5–5.0)
ALK PHOS: 35 U/L — AB (ref 38–126)
ALT: 12 U/L — ABNORMAL LOW (ref 14–54)
ANION GAP: 7 (ref 5–15)
AST: 19 U/L (ref 15–41)
BILIRUBIN TOTAL: 0.8 mg/dL (ref 0.3–1.2)
BUN: 6 mg/dL (ref 6–20)
CO2: 27 mmol/L (ref 22–32)
Calcium: 9.2 mg/dL (ref 8.9–10.3)
Chloride: 106 mmol/L (ref 101–111)
Creatinine, Ser: 0.56 mg/dL (ref 0.44–1.00)
GFR calc Af Amer: >60 mL/min (ref >60)
GLUCOSE: 99 mg/dL (ref 65–99)
Potassium: 3.4 mmol/L — ABNORMAL LOW (ref 3.5–5.1)
Sodium: 140 mmol/L (ref 135–145)
TOTAL PROTEIN: 6.8 g/dL (ref 6.5–8.1)

## 2016-09-07 LAB — URINE DRUG SCREEN, QUALITATIVE (ARMC ONLY)
Amphetamines, Ur Screen: POSITIVE — AB
BARBITURATES, UR SCREEN: NOT DETECTED
Benzodiazepine, Ur Scrn: NOT DETECTED
COCAINE METABOLITE, UR ~~LOC~~: NOT DETECTED
Cannabinoid 50 Ng, Ur ~~LOC~~: NOT DETECTED
MDMA (ECSTASY) UR SCREEN: NOT DETECTED
METHADONE SCREEN, URINE: NOT DETECTED
Opiate, Ur Screen: NOT DETECTED
Phencyclidine (PCP) Ur S: NOT DETECTED
TRICYCLIC, UR SCREEN: NOT DETECTED

## 2016-09-07 LAB — ACETAMINOPHEN LEVEL

## 2016-09-07 LAB — POCT PREGNANCY, URINE: Preg Test, Ur: NEGATIVE

## 2016-09-07 LAB — SALICYLATE LEVEL

## 2016-09-07 LAB — ETHANOL

## 2016-09-07 NOTE — ED Notes (Signed)
Pt brought over from [facility] where she was seen for a laceration to her head from a fall. 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 Pt refusing to open her eyes, states that since using meth she sees things that arent there and feels better keeping her eyes shut. Otherwise pt cooperative. Pt is voluntary. Mother took her clothes.

## 2016-09-07 NOTE — ED Provider Notes (Addendum)
Care signed over from physician's assistant Bridget Hartshornhonda Summers.  Briefly the patient is a 21 year old woman who has been using methamphetamine and Adderall daily for the past month and has begun to become paranoid and see visual hallucinations and hearing voices that are disturbing to her. She initially came to the emergency department for a head trauma but when she was to be discharged she really reported worsening hallucinations and requested help stopping methamphetamine.  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 TTS and telemetry psychiatry consult.   Merrily BrittleNeil Azul Brumett, MD 09/07/16 1541  The patient no longer wants to stay in the emergency department. She does have capacity to make medical decisions and she has a plan for how she will get home. Her boyfriend is here and will take her home. 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 She feels welcome to return to the emergency department at any point should she change her mind and continue wanting her care.   Merrily BrittleNeil Aerabella Galasso, MD 09/07/16 1556

## 2016-09-07 NOTE — ED Notes (Signed)
Pt choosing to leave without getting drug rehab. Er doctor spoke with her - pt is voluntary, not si/hi and can leave. Pt given clothes. Father walked with pt to exit. tts consult and telepsych consult cancelled.

## 2016-09-07 NOTE — Discharge Instructions (Signed)
If you change your mind and decided to you would like help stopping methamphetamine or for any other concerns please return to our emergency department at any point. We are more than happy to continue to care for you 24 hours a day.

## 2016-09-07 NOTE — ED Provider Notes (Signed)
[facility] Emergency Department Provider Note   ____________________________________________   First MD Initiated Contact with Patient 09/07/16 1243     (approximate)  I have reviewed the triage vital signs and the nursing notes.   HISTORY  Chief Complaint Fall    HPI Carolyn [name] is a 21 y.o. female care with the plain of fall earlier this morning. Patient states that she had a "dizzy spell " in the shower and fell and hit her head. She denies any loss of consciousness. She has some swelling to the left side of her forehead along with a very small laceration. Patient states that her last tetanus has been less than a year. Patient denies any nausea or vomiting. Patient is not forthcoming with any further history. Parents are in the room with the patient but was not present at the time of her fall. Parents also that patient and boyfriend were in a motel room. Parents give a history of patient with weight loss, mood swings, appetite changes. Patient rates her pain as 5/10.   Past Medical History:  Diagnosis Date  . Allergy   . Asthma   . Depression   . Pneumonia     Patient Active Problem List   Diagnosis Date Noted  . Normal labor and delivery 02/28/2016  . Irregular contractions 02/07/2016  . Hypertension in pregnancy, preeclampsia, severe, delivered 01/01/2015  . Postpartum care following vaginal delivery 12/31/2014  . Labor and delivery, indication for care 12/30/2014  . Abdominal pain 12/29/2014  . Labor and delivery indication for care or intervention 12/29/2014  . Braxton Hicks contractions 12/05/2014    History reviewed. No pertinent surgical history.  Prior to Admission medications   Medication Sig Start Date End Date Taking? Authorizing Provider  albuterol (PROVENTIL HFA;VENTOLIN HFA) 108 (90 Base) MCG/ACT inhaler Inhale 2 puffs into the lungs every 6 (six) hours as needed for wheezing or shortness of breath. 09/05/16   Jene Every, MD  ferrous sulfate (FERROUSUL) 325 (65 FE) MG tablet Take 1 tablet (325 mg total) by mouth 2 (two) times daily. 03/01/16   Vena Austria, MD  ibuprofen (ADVIL,MOTRIN) 600 MG tablet Take 1 tablet (600 mg total) by mouth every 6 (six) hours as needed for mild pain or moderate pain. Reported on 02/01/2016 03/01/16   Vena Austria, MD  Prenatal Vit-Fe Fumarate-FA (PRENATAL MULTIVITAMIN) TABS tablet Take 1 tablet by mouth daily at 12 noon.    Historical Provider, MD    Allergies Advair diskus [fluticasone-salmeterol]; Sulfa antibiotics; Prednisone; and Banana  Family History  Problem Relation Age of Onset  . Hypertension Father     Social History Social History  Substance Use Topics  . Smoking status: Never Smoker  . Smokeless tobacco: Never Used  . Alcohol use No    Review of Systems Constitutional: No fever/chills Eyes: Denies visual changes. States she cannot open eyes. ENT: No trauma. Cardiovascular: Denies chest pain. Respiratory: Denies shortness of breath. Gastrointestinal: No abdominal pain.  No nausea, no vomiting.   Musculoskeletal: Negative for back pain. Skin: Positive laceration left forehead. Neurological: Negative for headaches, focal weakness or numbness.  10-point ROS otherwise negative.  ____________________________________________   PHYSICAL EXAM:  VITAL SIGNS: ED Triage Vitals  Enc Vitals Group     BP 09/07/16 1136 123/82     Pulse Rate 09/07/16 1136 (!) 105     Resp 09/07/16 1136 16     Temp 09/07/16 1136 98 F (36.7 C)     Temp Source  09/07/16 1136 Oral     SpO2 09/07/16 1136 100 %     Weight 09/07/16 1132 100 lb (45.4 kg)     Height 09/07/16 1132 4\' 11"  (1.499 m)     Head Circumference --      Peak Flow --      Pain Score 09/07/16 1132 5     Pain Loc --      Pain Edu? --      Excl. in GC? --     Constitutional: The patient refuses to make eye contact or to open her eyes. Patient is limited in her history. Eyes: Sclera clear  however patient is fighting and refuses to open eyes to examine pupils. Patient states that she refuses to open her eyes. She denies any photophobia or visual changes. While in  triage patient had her eyes open and was not photophobic.  Head: There is soft tissue swelling present of left forehead with a superficial laceration. No active bleeding is present. Area is tender to touch. Nose: No congestion/rhinnorhea. No trauma. Mouth/Throat: Mucous membranes are moist.  Oropharynx non-erythematous. Neck: No stridor.  No cervical tenderness on palpation posteriorly. Cardiovascular: Normal rate, regular rhythm. Grossly normal heart sounds.  Good peripheral circulation. Respiratory: Normal respiratory effort.  No retractions. Lungs CTAB. Gastrointestinal: Soft and nontender. No distention.  Musculoskeletal: Moves upper and lower extremities without any difficulty. Normal gait was noted multiple times how the patient did have head downward. Parent is with the patient and returns her back to the room. Patient was able to get from supine to sitting position without any assistance. No obvious deformities were noted. Neurologic:  Normal speech and language. No gross focal neurologic deficits are appreciated. No gait instability. Skin:  Skin is warm, dry. Soft tissue swelling left forehead with superficial laceration. Psychiatric: Mood and affect are flat. Speech and behavior are withdrawn.  ____________________________________________   LABS (all labs ordered are listed, but only abnormal results are displayed)  Labs Reviewed  URINE DRUG SCREEN, QUALITATIVE ([facility] ONLY) - Abnormal; Notable for the following:       Result Value   Amphetamines, Ur Screen POSITIVE (*)    All other components within normal limits  ACETAMINOPHEN LEVEL - Abnormal; Notable for the following:    Acetaminophen (Tylenol), Serum <10 (*)    All other components within normal limits  SALICYLATE LEVEL  ETHANOL  COMPREHENSIVE  METABOLIC PANEL  CBC WITH DIFFERENTIAL/PLATELET  POC URINE PREG, ED  POCT PREGNANCY, URINE    PROCEDURES  Procedure(s) performed: None  Procedures  Critical Care performed: No  ____________________________________________   INITIAL IMPRESSION / ASSESSMENT AND PLAN / ED COURSE  Pertinent labs & imaging results that were available during my care of the patient were reviewed by me and considered in my medical decision making (see chart for details).  ----------------------------------------- 3:18 PM on 09/07/2016 ----------------------------------------- Patient gave permission to discuss results with her and family members (mother and father). Mother and father states that they are aware that patient is doing drugs. Patient admits to drug use and is requesting help. She denies any suicidal ideation at this time. Patient denies any drug use, alcohol in the last 24 hours. Patient maintains that she felt dizzy causing her to hit her head in the shower at the motel. Family is aware that the boyfriend is a "known drug dealer". Mother and father maintained that patient has become paranoid and has lost weight. Patient is now sitting and openly talking with eyes open. Patient was  made aware of the results of her CT scan and her lab work. Arrangements were made for the patient to be moved to a major hallway 20 per charge nurse.        ____________________________________________   FINAL CLINICAL IMPRESSION(S) / ED DIAGNOSES  Final diagnoses:  Contusion of face, initial encounter  Laceration of forehead, initial encounter  Drug abuse, amphetamine type      NEW MEDICATIONS STARTED DURING THIS VISIT:  New Prescriptions   No medications on file     Note:  This document was prepared using Dragon voice recognition software and may include unintentional dictation errors.    Tommi Rumps, PA-C 09/07/16 1537    Governor Rooks, MD 09/10/16 1224

## 2016-09-07 NOTE — ED Notes (Signed)
Report given to Susan, RN

## 2016-09-07 NOTE — ED Notes (Signed)
Pt presents to ED with c/o fall. Pt states had a "dizzy spell" in the shower and fell and hit her head. Pt with swelling noted to L side of her head, small laceration noted at injury site. This RN asked patient to open her eyes, pt is refusing to open eyes, this RN asked if it was due to pain, pt states "no I'm just not going to". Pt is alert and oriented at this time. Pt is noted to be fluttering her eye lids at this time.

## 2016-09-07 NOTE — ED Triage Notes (Signed)
Pt slipped in shower and hit head when she fell.  Small knot to forehead. Small abrasion noted.  No LOC or vomiting.

## 2016-09-13 DIAGNOSIS — J45909 Unspecified asthma, uncomplicated: Secondary | ICD-10-CM | POA: Insufficient documentation

## 2016-09-13 DIAGNOSIS — F1520 Other stimulant dependence, uncomplicated: Secondary | ICD-10-CM | POA: Insufficient documentation

## 2016-09-13 NOTE — ED Triage Notes (Addendum)
Patient states she is here for detox from meth and adderall.  Patient with noted bruising to left forehead, reports came from a fall several days ago.  Patient is cooperative with answering questions, but does not make eye contact with staff.

## 2016-09-14 ENCOUNTER — Emergency Department
Admission: EM | Admit: 2016-09-14 | Discharge: 2016-09-14 | Disposition: A | Discharge status: Home / Self Care | Payer: Self-pay | Attending: Emergency Medicine | Admitting: Emergency Medicine

## 2016-09-14 DIAGNOSIS — F1520 Other stimulant dependence, uncomplicated: Secondary | ICD-10-CM

## 2016-09-14 LAB — ETHANOL: Alcohol, Ethyl (B): <5 mg/dL (ref <5)

## 2016-09-14 LAB — COMPREHENSIVE METABOLIC PANEL
ALT: 8 U/L — AB (ref 14–54)
ANION GAP: 6 (ref 5–15)
AST: 20 U/L (ref 15–41)
Albumin: 5.1 g/dL — ABNORMAL HIGH (ref 3.5–5.0)
Alkaline Phosphatase: 44 U/L (ref 38–126)
BUN: 9 mg/dL (ref 6–20)
CHLORIDE: 105 mmol/L (ref 101–111)
CO2: 28 mmol/L (ref 22–32)
Calcium: 9.3 mg/dL (ref 8.9–10.3)
Creatinine, Ser: 0.59 mg/dL (ref 0.44–1.00)
GFR calc non Af Amer: >60 mL/min (ref >60)
Glucose, Bld: 104 mg/dL — ABNORMAL HIGH (ref 65–99)
Potassium: 3.3 mmol/L — ABNORMAL LOW (ref 3.5–5.1)
SODIUM: 139 mmol/L (ref 135–145)
Total Bilirubin: 0.8 mg/dL (ref 0.3–1.2)
Total Protein: 7.9 g/dL (ref 6.5–8.1)

## 2016-09-14 LAB — CBC
HEMATOCRIT: 39.3 % (ref 35.0–47.0)
Hemoglobin: 12.6 g/dL (ref 12.0–16.0)
MCH: 24.7 pg — AB (ref 26.0–34.0)
MCHC: 31.9 g/dL — AB (ref 32.0–36.0)
MCV: 77.4 fL — AB (ref 80.0–100.0)
PLATELETS: 331 10*3/uL (ref 150–440)
RBC: 5.08 MIL/uL (ref 3.80–5.20)
RDW: 17.7 % — ABNORMAL HIGH (ref 11.5–14.5)
WBC: 5 10*3/uL (ref 3.6–11.0)

## 2016-09-14 LAB — ACETAMINOPHEN LEVEL: Acetaminophen (Tylenol), Serum: <10 ug/mL — ABNORMAL LOW (ref 10–30)

## 2016-09-14 LAB — SALICYLATE LEVEL

## 2016-09-14 NOTE — ED Provider Notes (Signed)
[facility] Emergency Department Provider Note   ____________________________________________   First MD Initiated Contact with Patient 09/14/16 [phone]     (approximate)  I have reviewed the triage vital signs and the nursing notes.   HISTORY  Chief Complaint Detox Eval    HPI Carolyn [name] is a 21 y.o. female who presents to the ED from home requesting detox from meth and Adderall.States she is buying substances on the street and using them. Reports last use 6 hours ago. Called her parents to bring her to the emergency department for detox. Denies active SI/HI/AH/VH. Voices no medical complaints. Old bruising noted to left forehead; when queried patient reports she fell several days ago. Denies recent fever, chills, chest pain, shortness of breath, abdominal pain, nausea, vomiting, diarrhea.   Past Medical History:  Diagnosis Date  . Allergy   . Asthma   . Depression   . Pneumonia     Patient Active Problem List   Diagnosis Date Noted  . Normal labor and delivery 02/28/2016  . Irregular contractions 02/07/2016  . Hypertension in pregnancy, preeclampsia, severe, delivered 01/01/2015  . Postpartum care following vaginal delivery 12/31/2014  . Labor and delivery, indication for care 12/30/2014  . Abdominal pain 12/29/2014  . Labor and delivery indication for care or intervention 12/29/2014  . Braxton Hicks contractions 12/05/2014    No past surgical history on file.  Prior to Admission medications   Medication Sig Start Date End Date Taking? Authorizing Provider  albuterol (PROVENTIL HFA;VENTOLIN HFA) 108 (90 Base) MCG/ACT inhaler Inhale 2 puffs into the lungs every 6 (six) hours as needed for wheezing or shortness of breath. 09/05/16   Jene Every, MD  ferrous sulfate (FERROUSUL) 325 (65 FE) MG tablet Take 1 tablet (325 mg total) by mouth 2 (two) times daily. 03/01/16   Vena Austria, MD  ibuprofen (ADVIL,MOTRIN) 600 MG tablet Take 1  tablet (600 mg total) by mouth every 6 (six) hours as needed for mild pain or moderate pain. Reported on 02/01/2016 03/01/16   Vena Austria, MD  Prenatal Vit-Fe Fumarate-FA (PRENATAL MULTIVITAMIN) TABS tablet Take 1 tablet by mouth daily at 12 noon.    Historical Provider, MD    Allergies Advair diskus [fluticasone-salmeterol]; Sulfa antibiotics; Prednisone; and Banana  Family History  Problem Relation Age of Onset  . Hypertension Father     Social History Social History  Substance Use Topics  . Smoking status: Never Smoker  . Smokeless tobacco: Never Used  . Alcohol use No    Review of Systems Constitutional: No fever/chills. Eyes: No visual changes. ENT: No sore throat. Cardiovascular: Denies chest pain. Respiratory: Denies shortness of breath. Gastrointestinal: No abdominal pain.  No nausea, no vomiting.  No diarrhea.  No constipation. Genitourinary: Negative for dysuria. Musculoskeletal: Negative for back pain. Skin: Negative for rash. Neurological: Negative for headaches, focal weakness or numbness. Psychiatric: Negative for SI.  10-point ROS otherwise negative.  ____________________________________________   PHYSICAL EXAM:  VITAL SIGNS: ED Triage Vitals [09/13/16 2359]  Enc Vitals Group     BP (!) 130/93     Pulse Rate 92     Resp 20     Temp 98.5 F (36.9 C)     Temp Source Oral     SpO2 100 %     Weight 100 lb (45.4 kg)     Height 4\' 11"  (1.499 m)     Head Circumference      Peak Flow      Pain  Score      Pain Loc      Pain Edu?      Excl. in GC?     Constitutional: Asleep, awakened for exam. Alert and oriented. Well appearing and in no acute distress. Eyes: Conjunctivae are normal. PERRL. EOMI. Head: Healing eccymosis to left forehead. Nose: No congestion/rhinnorhea. Mouth/Throat: Mucous membranes are moist.  Oropharynx non-erythematous. Neck: No stridor.  No cervical spine tenderness to palpation. Cardiovascular: Normal rate, regular  rhythm. Grossly normal heart sounds.  Good peripheral circulation. Respiratory: Normal respiratory effort.  No retractions. Lungs CTAB. Gastrointestinal: Soft and nontender. No distention. No abdominal bruits. No CVA tenderness. Musculoskeletal: No lower extremity tenderness nor edema.  No joint effusions. Neurologic:  Normal speech and language. No gross focal neurologic deficits are appreciated. No gait instability. Skin:  Skin is warm, dry and intact. No rash noted. Psychiatric: Mood and affect are normal. Speech and behavior are normal.  ____________________________________________   LABS (all labs ordered are listed, but only abnormal results are displayed)  Labs Reviewed  COMPREHENSIVE METABOLIC PANEL - Abnormal; Notable for the following:       Result Value   Potassium 3.3 (*)    Glucose, Bld 104 (*)    Albumin 5.1 (*)    ALT 8 (*)    All other components within normal limits  ACETAMINOPHEN LEVEL - Abnormal; Notable for the following:    Acetaminophen (Tylenol), Serum <10 (*)    All other components within normal limits  CBC - Abnormal; Notable for the following:    MCV 77.4 (*)    MCH 24.7 (*)    MCHC 31.9 (*)    RDW 17.7 (*)    All other components within normal limits  ETHANOL  SALICYLATE LEVEL  URINE DRUG SCREEN, QUALITATIVE ([facility] ONLY)  POC URINE PREG, ED   ____________________________________________  EKG  None ____________________________________________  RADIOLOGY  None ____________________________________________   PROCEDURES  Procedure(s) performed: None  Procedures  Critical Care performed: No  ____________________________________________   INITIAL IMPRESSION / ASSESSMENT AND PLAN / ED COURSE  Pertinent labs & imaging results that were available during my care of the patient were reviewed by me and considered in my medical decision making (see chart for details).  21 year old female requesting detox from meth and Adderall. TTS gave  patient resources for outpatient detox. Strict return precautions given. Patient verbalizes understanding and agrees with plan of care.      ____________________________________________   FINAL CLINICAL IMPRESSION(S) / ED DIAGNOSES  Final diagnoses:  Stimulant dependence (HCC)      NEW MEDICATIONS STARTED DURING THIS VISIT:  New Prescriptions   No medications on file     Note:  This document was prepared using Dragon voice recognition software and may include unintentional dictation errors.    Irean HongJade J Aunya Lemler, MD 09/14/16 [phone]

## 2016-09-14 NOTE — ED Notes (Signed)
Pt states she has been using meth and adderal that she is buying on the street. Pt requesting detox for the same. Pt states last used meth 6 hours ago.

## 2016-09-14 NOTE — Discharge Instructions (Signed)
Please call one of the treatment centers from the resources provided to you to seek detox. Return to the ER for worsening symptoms, persistent vomiting, difficulty breathing or other concerns.

## 2016-09-19 ENCOUNTER — Encounter: Payer: Self-pay | Admitting: Emergency Medicine

## 2016-09-19 ENCOUNTER — Emergency Department
Admission: EM | Admit: 2016-09-19 | Discharge: 2016-09-19 | Disposition: A | Discharge status: Home / Self Care | Payer: Self-pay | Attending: Student in an Organized Health Care Education/Training Program | Admitting: Student in an Organized Health Care Education/Training Program

## 2016-09-19 DIAGNOSIS — F1510 Other stimulant abuse, uncomplicated: Secondary | ICD-10-CM

## 2016-09-19 DIAGNOSIS — J45909 Unspecified asthma, uncomplicated: Secondary | ICD-10-CM | POA: Insufficient documentation

## 2016-09-19 DIAGNOSIS — Z79899 Other long term (current) drug therapy: Secondary | ICD-10-CM | POA: Insufficient documentation

## 2016-09-19 DIAGNOSIS — R45851 Suicidal ideations: Secondary | ICD-10-CM

## 2016-09-19 DIAGNOSIS — I10 Essential (primary) hypertension: Secondary | ICD-10-CM | POA: Insufficient documentation

## 2016-09-19 DIAGNOSIS — F15951 Other stimulant use, unspecified with stimulant-induced psychotic disorder with hallucinations: Secondary | ICD-10-CM

## 2016-09-19 LAB — COMPREHENSIVE METABOLIC PANEL
ALT: 13 U/L — ABNORMAL LOW (ref 14–54)
ANION GAP: 8 (ref 5–15)
AST: 19 U/L (ref 15–41)
Albumin: 4.9 g/dL (ref 3.5–5.0)
Alkaline Phosphatase: 46 U/L (ref 38–126)
BUN: 8 mg/dL (ref 6–20)
CO2: 28 mmol/L (ref 22–32)
Calcium: 9.5 mg/dL (ref 8.9–10.3)
Chloride: 103 mmol/L (ref 101–111)
Creatinine, Ser: 0.71 mg/dL (ref 0.44–1.00)
GFR calc non Af Amer: >60 mL/min (ref >60)
GLUCOSE: 111 mg/dL — AB (ref 65–99)
POTASSIUM: 4 mmol/L (ref 3.5–5.1)
SODIUM: 139 mmol/L (ref 135–145)
Total Bilirubin: 0.5 mg/dL (ref 0.3–1.2)
Total Protein: 8.1 g/dL (ref 6.5–8.1)

## 2016-09-19 LAB — URINE DRUG SCREEN, QUALITATIVE (ARMC ONLY)
AMPHETAMINES, UR SCREEN: POSITIVE — AB
BARBITURATES, UR SCREEN: NOT DETECTED
BENZODIAZEPINE, UR SCRN: NOT DETECTED
COCAINE METABOLITE, UR ~~LOC~~: NOT DETECTED
Cannabinoid 50 Ng, Ur ~~LOC~~: NOT DETECTED
MDMA (Ecstasy)Ur Screen: NOT DETECTED
METHADONE SCREEN, URINE: NOT DETECTED
OPIATE, UR SCREEN: NOT DETECTED
Phencyclidine (PCP) Ur S: NOT DETECTED
TRICYCLIC, UR SCREEN: NOT DETECTED

## 2016-09-19 LAB — ACETAMINOPHEN LEVEL

## 2016-09-19 LAB — CBC
HEMATOCRIT: 40.5 % (ref 35.0–47.0)
HEMOGLOBIN: 13 g/dL (ref 12.0–16.0)
MCH: 24.7 pg — ABNORMAL LOW (ref 26.0–34.0)
MCHC: 31.9 g/dL — AB (ref 32.0–36.0)
MCV: 77.5 fL — ABNORMAL LOW (ref 80.0–100.0)
Platelets: 354 10*3/uL (ref 150–440)
RBC: 5.23 MIL/uL — ABNORMAL HIGH (ref 3.80–5.20)
RDW: 18.1 % — AB (ref 11.5–14.5)
WBC: 6.9 10*3/uL (ref 3.6–11.0)

## 2016-09-19 LAB — POCT PREGNANCY, URINE: Preg Test, Ur: NEGATIVE

## 2016-09-19 LAB — ETHANOL: Alcohol, Ethyl (B): <5 mg/dL (ref <5)

## 2016-09-19 LAB — SALICYLATE LEVEL

## 2016-09-19 MED ORDER — LORAZEPAM 1 MG PO TABS
1.0000 mg | ORAL_TABLET | Freq: Once | ORAL | Status: AC
  Administered 2016-09-19: 1 mg via ORAL

## 2016-09-19 MED ORDER — LORAZEPAM 1 MG PO TABS
1.0000 mg | ORAL_TABLET | Freq: Once | ORAL | Status: AC
  Administered 2016-09-19: 1 mg via ORAL
  Filled 2016-09-19: qty 1

## 2016-09-19 MED ORDER — LORAZEPAM 1 MG PO TABS
ORAL_TABLET | ORAL | Status: AC
  Filled 2016-09-19: qty 1

## 2016-09-19 NOTE — Consult Note (Signed)
[facility] Consult   Reason for Consult:  Consult for 21 year old woman who was brought to the emergency room by police because of acting strange and public Referring Physician:  Quentin Cornwall Patient Identification: Carolyn [name] MRN:  [id number] Principal Diagnosis: Amphetamine and psychostimulant-induced psychosis with hallucinations [facility] [place]) Diagnosis:   Patient Active Problem List   Diagnosis Date Noted  . Amphetamine and psychostimulant-induced psychosis with hallucinations ([place]) [F15.951] 09/19/2016  . Amphetamine abuse [F15.10] 09/19/2016  . Normal labor and delivery [O80] 02/28/2016  . Irregular contractions [O62.2] 02/07/2016  . Hypertension in pregnancy, preeclampsia, severe, delivered [O14.10] 01/01/2015  . Postpartum care following vaginal delivery [Z39.2] 12/31/2014  . Labor and delivery, indication for care [O75.9] 12/30/2014  . Abdominal pain [R10.9] 12/29/2014  . Labor and delivery indication for care or intervention [O75.9] 12/29/2014  . Braxton Hicks contractions [O47.9] 12/05/2014    Total Time spent with patient: 1 hour  Subjective:   [name] is a 21 y.o. female patient admitted with "I was having an emergency with schizophrenia".  HPI:  Patient interviewed. Chart reviewed. Labs reviewed. This is a 21 year old woman who was brought in by police who found her wandering around outside a motel acting bizarrely. Patient says that she was hearing and seeing things. She can't really describe exactly what it was now. She said it was just sounds and scratching. No command hallucinations. She is vague on the time course of it saying that it been going on maybe 4 hours maybe longer. He is also vague about her mood saying she is been feeling bad but denies any suicidal or homicidal thoughts. Patient initially denied to me that she was using any drugs but when confronted with her drug screen admitted that she is been smoking methamphetamine. Denies that  she's been drinking or using any other drugs. He says the hallucinations have now gone away. Not having any suicidal thoughts not acting aggressively. Patient says now that she wants to get away from her boyfriend and stop using drugs.  Social history: Her biological grandparents are her adoptive parents and are here willing to take her home. She says she is willing to go stay with them. She is apparently been involved with a "boyfriend" who supplies her with drugs. She tells me that she is been "hustling" to make money recently which suggests any number of unpleasant situations.  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 No known ongoing medical issues. She has a bruise on her left temple region doesn't remember or won't say how she got it. She looks kind of underweight. Probably has been not eating well she's been using meth.  Substance abuse history: Nothing in the old chart mentions anything previously but her biological grandparents say that she has a drug problem that they've been aware of for a while. She doesn't even remember how long she's been using stimulants. Apparently never been in any specific substance abuse treatment.  Past Psychiatric History: She says that she thinks she was once in a psychiatric hospital in [place]. Can't really remember the details. She remembers having been given a prescription in the past for Prozac but never really took it and didn't think it was of any benefit. Denies any history of suicide attempts or violence. Although she used the word schizophrenia there is no indication that anyone else is ever given her that diagnosis.  Risk to Self: Suicidal Ideation: No-Not Currently/Within Last 6 Months Suicidal Intent: No-Not Currently/Within Last  6 Months Is patient at risk for suicide?: No Suicidal Plan?: No Access to Means:  (n/a) What has been your use of drugs/alcohol within the last 12 months?: Pt states  THC  (Meth per chart review ) How many times?: 0 Other Self Harm Risks: drug use  Triggers for Past Attempts: Other (Comment) (n/a) Intentional Self Injurious Behavior: None Risk to Others: Homicidal Ideation: No Thoughts of Harm to Others: No Current Homicidal Intent: No Current Homicidal Plan: No Access to Homicidal Means: No Identified Victim: n/a History of harm to others?: No Assessment of Violence: None Noted Violent Behavior Description: none Does patient have access to weapons?: No Criminal Charges Pending?: No Does patient have a court date: No Prior Inpatient Therapy: Prior Inpatient Therapy: No Prior Therapy Dates: n/a Prior Therapy Facilty/Provider(s): n/a Reason for Treatment: n/a Prior Outpatient Therapy: Prior Outpatient Therapy: No Prior Therapy Dates: n/a Prior Therapy Facilty/Provider(s): n/a Reason for Treatment: n/a Does patient have an ACCT team?: No Does patient have Intensive In-House Services?  : No Does patient have [facility] services? : No Does patient have P4CC services?: No  Past Medical History:  Past Medical History:  Diagnosis Date  . Allergy   . Asthma   . Depression   . Pneumonia    No past surgical history on file. Family History:  Family History  Problem Relation Age of Onset  . Hypertension Father    Family Psychiatric  History: No known family history Social History:  History  Alcohol Use No     History  Drug Use No    Social History   Social History  . Marital status: Single    Spouse name: N/A  . Number of children: N/A  . Years of education: N/A   Social History Main Topics  . Smoking status: Never Smoker  . Smokeless tobacco: Never Used  . Alcohol use No  . Drug use: No  . Sexual activity: Not Asked   Other Topics Concern  . None   Social History Narrative  . None   Additional Social History:    Allergies:   Allergies  Allergen Reactions  . Advair Diskus [Fluticasone-Salmeterol] Other (See Comments)      Pt states she "blacked out"  . Sulfa Antibiotics Other (See Comments)    Infant  . Prednisone     Made her bonkers  . Banana Itching    Labs:  Results for orders placed or performed during the hospital encounter of 09/19/16 (from the past 48 hour(s))  Comprehensive metabolic panel     Status: Abnormal   Collection Time: 09/19/16  8:52 AM  Result Value Ref Range   Sodium 139 135 - 145 mmol/L   Potassium 4.0 3.5 - 5.1 mmol/L   Chloride 103 101 - 111 mmol/L   CO2 28 22 - 32 mmol/L   Glucose, Bld 111 (H) 65 - 99 mg/dL   BUN 8 6 - 20 mg/dL   Creatinine, Ser 0.71 0.44 - 1.00 mg/dL   Calcium 9.5 8.9 - 10.3 mg/dL   Total Protein 8.1 6.5 - 8.1 g/dL   Albumin 4.9 3.5 - 5.0 g/dL   AST 19 15 - 41 U/L   ALT 13 (L) 14 - 54 U/L   Alkaline Phosphatase 46 38 - 126 U/L   Total Bilirubin 0.5 0.3 - 1.2 mg/dL   GFR calc non Af Amer >60 >60 mL/min   GFR calc Af Amer >60 >60 mL/min    Comment: (NOTE) The eGFR has been  calculated using the CKD EPI equation. This calculation has not been validated in all clinical situations. eGFR's persistently <60 mL/min signify possible Chronic Kidney Disease.    Anion gap 8 5 - 15  Ethanol     Status: None   Collection Time: 09/19/16  8:52 AM  Result Value Ref Range   Alcohol, Ethyl (B) <5 <5 mg/dL    Comment:        LOWEST DETECTABLE LIMIT FOR SERUM ALCOHOL IS 5 mg/dL FOR MEDICAL PURPOSES ONLY   Salicylate level     Status: None   Collection Time: 09/19/16  8:52 AM  Result Value Ref Range   Salicylate Lvl <7.5 2.8 - 30.0 mg/dL  Acetaminophen level     Status: Abnormal   Collection Time: 09/19/16  8:52 AM  Result Value Ref Range   Acetaminophen (Tylenol), Serum <10 (L) 10 - 30 ug/mL    Comment:        THERAPEUTIC CONCENTRATIONS VARY SIGNIFICANTLY. A RANGE OF 10-30 ug/mL MAY BE AN EFFECTIVE CONCENTRATION FOR MANY PATIENTS. HOWEVER, SOME ARE BEST TREATED AT CONCENTRATIONS OUTSIDE THIS RANGE. ACETAMINOPHEN CONCENTRATIONS >150 ug/mL AT 4 HOURS  AFTER INGESTION AND >50 ug/mL AT 12 HOURS AFTER INGESTION ARE OFTEN ASSOCIATED WITH TOXIC REACTIONS.   cbc     Status: Abnormal   Collection Time: 09/19/16  8:52 AM  Result Value Ref Range   WBC 6.9 3.6 - 11.0 K/uL   RBC 5.23 (H) 3.80 - 5.20 MIL/uL   Hemoglobin 13.0 12.0 - 16.0 g/dL   HCT 40.5 35.0 - 47.0 %   MCV 77.5 (L) 80.0 - 100.0 fL   MCH 24.7 (L) 26.0 - 34.0 pg   MCHC 31.9 (L) 32.0 - 36.0 g/dL   RDW 18.1 (H) 11.5 - 14.5 %   Platelets 354 150 - 440 K/uL  Urine Drug Screen, Qualitative     Status: Abnormal   Collection Time: 09/19/16  9:24 AM  Result Value Ref Range   Tricyclic, Ur Screen NONE DETECTED NONE DETECTED   Amphetamines, Ur Screen POSITIVE (A) NONE DETECTED   MDMA (Ecstasy)Ur Screen NONE DETECTED NONE DETECTED   Cocaine Metabolite,Ur [place] NONE DETECTED NONE DETECTED   Opiate, Ur Screen NONE DETECTED NONE DETECTED   Phencyclidine (PCP) Ur S NONE DETECTED NONE DETECTED   Cannabinoid 50 Ng, Ur [place] NONE DETECTED NONE DETECTED   Barbiturates, Ur Screen NONE DETECTED NONE DETECTED   Benzodiazepine, Ur Scrn NONE DETECTED NONE DETECTED   Methadone Scn, Ur NONE DETECTED NONE DETECTED    Comment: (NOTE) 643  Tricyclics, urine               Cutoff 1000 ng/mL 200  Amphetamines, urine             Cutoff 1000 ng/mL 300  MDMA (Ecstasy), urine           Cutoff 500 ng/mL 400  Cocaine Metabolite, urine       Cutoff 300 ng/mL 500  Opiate, urine                   Cutoff 300 ng/mL 600  Phencyclidine (PCP), urine      Cutoff 25 ng/mL 700  Cannabinoid, urine              Cutoff 50 ng/mL 800  Barbiturates, urine             Cutoff 200 ng/mL 900  Benzodiazepine, urine           Cutoff 200  ng/mL 1000 Methadone, urine                Cutoff 300 ng/mL 1100 1200 The urine drug screen provides only a preliminary, unconfirmed 1300 analytical test result and should not be used for non-medical 1400 purposes. Clinical consideration and professional judgment should 1500 be applied to any  positive drug screen result due to possible 1600 interfering substances. A more specific alternate chemical method 1700 must be used in order to obtain a confirmed analytical result.  1800 Gas chromato graphy / mass spectrometry (GC/MS) is the preferred 1900 confirmatory method.   Pregnancy, urine POC     Status: None   Collection Time: 09/19/16  9:34 AM  Result Value Ref Range   Preg Test, Ur NEGATIVE NEGATIVE    Comment:        THE SENSITIVITY OF THIS METHODOLOGY IS >24 mIU/mL     No current facility-administered medications for this encounter.    Current Outpatient Prescriptions  Medication Sig Dispense Refill  . albuterol (PROVENTIL HFA;VENTOLIN HFA) 108 (90 Base) MCG/ACT inhaler Inhale 2 puffs into the lungs every 6 (six) hours as needed for wheezing or shortness of breath. (Patient not taking: Reported on 09/19/2016) 1 Inhaler 2  . ferrous sulfate (FERROUSUL) 325 (65 FE) MG tablet Take 1 tablet (325 mg total) by mouth 2 (two) times daily. (Patient not taking: Reported on 09/19/2016) 60 tablet 1  . ibuprofen (ADVIL,MOTRIN) 600 MG tablet Take 1 tablet (600 mg total) by mouth every 6 (six) hours as needed for mild pain or moderate pain. Reported on 02/01/2016 (Patient not taking: Reported on 09/19/2016) 30 tablet 0    Musculoskeletal: Strength & Muscle Tone: within normal limits Gait & Station: normal Patient leans: N/A  Psychiatric Specialty Exam: Physical Exam  Constitutional: She appears well-developed. She has a sickly appearance.  HENT:  Head: Normocephalic and atraumatic.    Eyes: Conjunctivae are normal. Pupils are equal, round, and reactive to light.  Neck: Normal range of motion.  Cardiovascular: Normal heart sounds.   Respiratory: Effort normal.  GI: Soft.  Musculoskeletal: Normal range of motion.  Neurological: She is alert.  Skin: Skin is warm and dry.  Psychiatric: Her affect is blunt. Her speech is delayed. She is slowed. She expresses impulsivity. She  expresses no homicidal and no suicidal ideation. She exhibits abnormal recent memory.    Review of Systems  Constitutional: Negative.   HENT: Negative.   Eyes: Negative.   Respiratory: Negative.   Cardiovascular: Negative.   Gastrointestinal: Negative.   Musculoskeletal: Negative.   Skin: Negative.   Neurological: Negative.   Psychiatric/Behavioral: Positive for hallucinations, memory loss and substance abuse. Negative for depression and suicidal ideas. The patient is not nervous/anxious and does not have insomnia.     Blood pressure 127/90, pulse 94, temperature 98 F (36.7 C), temperature source Oral, resp. rate 16, height 4' 11" (1.499 m), weight 45.4 kg (100 lb), SpO2 100 %, unknown if currently breastfeeding.Body mass index is 20.2 kg/m.  General Appearance: Disheveled  Eye Contact:  Fair  Speech:  Slow  Volume:  Decreased  Mood:  Euthymic  Affect:  Blunt  Thought Process:  Disorganized  Orientation:  Full (Time, Place, and Person)  Thought Content:  Rumination  Suicidal Thoughts:  No  Homicidal Thoughts:  No  Memory:  Immediate;   Fair Recent;   Poor Remote;   Fair  Judgement:  Impaired  Insight:  Shallow  Psychomotor Activity:  Decreased  Concentration:  Concentration: Fair  Recall:  Poor  Fund of Knowledge:  Fair  Language:  Fair  Akathisia:  No  Handed:  Right  AIMS (if indicated):     Assets:  Desire for Improvement Resilience Social Support  ADL's:  Intact  Cognition:  Impaired,  Mild  Sleep:        Treatment Plan Summary: Plan This is a 21 year old woman who is probably been using methamphetamine for a while losing weight and not taking care of herself. Having psychotic symptoms. Those. 2 of cleared up at this point. Patient is denying any suicidal or homicidal ideation. She was offered the possibility of admission to the hospital but says she does not want to do that and wants to go home with her parents. Patient will be taken off IVC and can be  discharged home with her parents. She will be given ample information about available local mental health and substance abuse resources and is encouraged to get into outpatient treatment so she can start taking care of herself. She agrees to the plan. Case reviewed with emergency room doctor and TTS.  Disposition: Patient does not meet criteria for psychiatric inpatient admission. Supportive therapy provided about ongoing stressors.  Alethia Berthold, MD 09/19/2016 4:14 PM

## 2016-09-19 NOTE — BH Assessment (Signed)
Assessment Note  Carolyn [name] is an 21 y.o. female. Who initially presented to the hospital voluntarily seeking detox from meth and adderall.  Patient with noted bruising to left forehead, reports came from a fall several days ago. Pt oriented x4. While in the ER pt reported to multiple staff that she had thoughts of wanting to harm herself, although patient denied these thoughts while engaging in this interview. When asked about risk to self within the past six months patient states "well sort of." Patient then responds "well I guess I'm not, I just have mood swings." Patient later denied suicidality altogether. Stating that she has had thoughts of suicide in the past but not within the past 6 months. Patient has contracted for safety. At this time patient denies any illicit drug use outside of occasional marijuana. Pt shared that her PCP diagnosed her with Depression several years ago. She was placed on Prozac which she did not take. Writer has received information from the patient's adoptive mother and father who state that  although the patient's never been formally diagnosed ,outside of seeing her primary care physician, they believe that there are underlying mental health issues. They report that depression runs in the family system and that the patient has a very bad addiction problem. They reported that they suspect the patient is using crystal meth and that her boyfriend it's her distributor.No current or previous outpatient provider reported.No previous inpatient hospitalizations reported.. Pt unable to provide clear history. Pt has acknowledged previous depressive sx although pt continues to minimize symptomology and addiction patterns . Pt presenting with impaired insight, judgement and impulse control, further evaluation is recommended.     Diagnosis: Major Depressive Disorder    Past Medical History:  Past Medical History:  Diagnosis Date  . Allergy   . Asthma   . Depression   .  Pneumonia     No past surgical history on file.  Family History:  Family History  Problem Relation Age of Onset  . Hypertension Father     Social History:  reports that she has never smoked. She has never used smokeless tobacco. She reports that she does not drink alcohol or use drugs.  Additional Social History:  Alcohol / Drug Use Pain Medications: SEE MAR Prescriptions: SEE MAR Over the Counter: SEE MAR History of alcohol / drug use?: Yes Longest period of sobriety (when/how long): unknown, denies use of any drugs outside of THC  Substance #1 Name of Substance 1: THC  1 - Age of First Use: 21 y.o. 1 - Amount (size/oz): 1 blunt  1 - Frequency: 3/4 times a month 1 - Duration: ongoing  1 - Last Use / Amount: x 3 days ago  Substance #2 Name of Substance 2:  Meth per, chart - Pt denies  2 - Age of First Use: Pt denies  2 - Amount (size/oz): Pt denies  2 - Frequency: Pt denies  2 - Duration: Pt denies  2 - Last Use / Amount: Pt denies   CIWA: CIWA-Ar BP: 127/90 Pulse Rate: 94 COWS:    Allergies:  Allergies  Allergen Reactions  . Advair Diskus [Fluticasone-Salmeterol] Other (See Comments)    Pt states she "blacked out"  . Sulfa Antibiotics Other (See Comments)    Infant  . Prednisone     Made her bonkers  . Banana Itching    Home Medications:  (Not in a hospital admission)  OB/GYN Status:  No LMP recorded. Patient is not currently having periods (  Reason: Irregular Periods).  General Assessment Data Location of Assessment: [facility] ED TTS Assessment: In system Is this a Tele or Face-to-Face Assessment?: Face-to-Face Is this an Initial Assessment or a Re-assessment for this encounter?: Initial Assessment Marital status: Single Is patient pregnant?: No Pregnancy Status: No Living Arrangements: Other (Comment) (Homeless ) Can pt return to current living arrangement?: Yes Admission Status: Voluntary Is patient capable of signing voluntary admission?:  Yes Referral Source: Self/Family/Friend Insurance type: None   Medical Screening Exam [facility] Walk-in ONLY) Medical Exam completed: Yes  Crisis Care Plan Living Arrangements: Other (Comment) (Homeless ) Legal Guardian: Other: (n/a) Name of Psychiatrist: none  Name of Therapist: none  Education Status Is patient currently in school?: No Current Grade: n/a Highest grade of school patient has completed: some college  Name of school: n/a Contact person: n/a  Risk to self with the past 6 months Suicidal Ideation: No-Not Currently/Within Last 6 Months Has patient been a risk to self within the past 6 months prior to admission? : Yes (per chart ) Suicidal Intent: No-Not Currently/Within Last 6 Months Has patient had any suicidal intent within the past 6 months prior to admission? : No Is patient at risk for suicide?: No Suicidal Plan?: No Has patient had any suicidal plan within the past 6 months prior to admission? : No Access to Means:  (n/a) What has been your use of drugs/alcohol within the last 12 months?: Pt states THC  (Meth per chart review ) Previous Attempts/Gestures: No How many times?: 0 Other Self Harm Risks: drug use  Triggers for Past Attempts: Other (Comment) (n/a) Intentional Self Injurious Behavior: None Family Suicide History: No Recent stressful life event(s): Conflict (Comment), Other (Comment) Persecutory voices/beliefs?: No Depression: Yes Depression Symptoms: Insomnia, Isolating Substance abuse history and/or treatment for substance abuse?: Yes (Meth and THC )  Risk to Others within the past 6 months Homicidal Ideation: No Does patient have any lifetime risk of violence toward others beyond the six months prior to admission? : No Thoughts of Harm to Others: No Current Homicidal Intent: No Current Homicidal Plan: No Access to Homicidal Means: No Identified Victim: n/a History of harm to others?: No Assessment of Violence: None Noted Violent Behavior  Description: none Does patient have access to weapons?: No Criminal Charges Pending?: No Does patient have a court date: No Is patient on probation?: No  Psychosis Hallucinations: None noted (Pt denies ) Delusions: None noted (Pt denies )  Mental Status Report Appearance/Hygiene: Disheveled, In scrubs Eye Contact: Poor Motor Activity: Freedom of movement Speech: Other (Comment) (paucity) Level of Consciousness: Restless Mood: Ambivalent Affect: Flat, Constricted Anxiety Level: Minimal Thought Processes: Relevant Judgement: Partial Orientation: Situation, Time, Place, Person Obsessive Compulsive Thoughts/Behaviors: None  Cognitive Functioning Concentration: Poor Memory: Remote Intact, Recent Intact IQ: Average Insight: Poor Impulse Control: Poor Appetite: Fair Weight Loss: 0 Weight Gain: 0 Sleep: No Change Total Hours of Sleep: 6  ADLScreening [facility] Assessment Services) Patient's cognitive ability adequate to safely complete daily activities?: Yes Patient able to express need for assistance with ADLs?: Yes Independently performs ADLs?: Yes (appropriate for developmental age)  Prior Inpatient Therapy Prior Inpatient Therapy: No Prior Therapy Dates: n/a Prior Therapy Facilty/Provider(s): n/a Reason for Treatment: n/a  Prior Outpatient Therapy Prior Outpatient Therapy: No Prior Therapy Dates: n/a Prior Therapy Facilty/Provider(s): n/a Reason for Treatment: n/a Does patient have an ACCT team?: No Does patient have Intensive In-House Services?  : No Does patient have [facility] services? : No Does patient have P4CC services?:  No  ADL Screening (condition at time of admission) Patient's cognitive ability adequate to safely complete daily activities?: Yes Patient able to express need for assistance with ADLs?: Yes Independently performs ADLs?: Yes (appropriate for developmental age)       Abuse/Neglect Assessment (Assessment to be complete while patient is  alone) Physical Abuse: Denies Verbal Abuse: Denies Sexual Abuse: Denies Exploitation of patient/patient's resources: Denies Self-Neglect: Denies Values / Beliefs Cultural Requests During Hospitalization: None Spiritual Requests During Hospitalization: None Consults Spiritual Care Consult Needed: No Social Work Consult Needed: No Merchant navy officer (For Healthcare) Does Patient Have a Medical Advance Directive?: No    Additional Information 1:1 In Past 12 Months?: No CIRT Risk: No Elopement Risk: No Does patient have medical clearance?: Yes     Disposition:  Disposition Initial Assessment Completed for this Encounter: Yes Disposition of Patient: Referred to Patient referred to: Other (Comment) Barnie Mort MD consult )  On Site Evaluation by:   Reviewed with Physician:    Asa Saunas 09/19/2016 3:25 PM

## 2016-09-19 NOTE — ED Notes (Signed)
Pt changed into paper scrubs by this writer. 

## 2016-09-19 NOTE — ED Notes (Signed)
nicole from social work in room with patient.

## 2016-09-19 NOTE — Progress Notes (Signed)
LCSW was consulted with Dr Toni Amendlapacs and this patient is going to DC as well. LCSW will provide patient with community resource lists and  Treatment options for her to consider agreed to return home to her mother and grandparents.  Delta Air LinesClaudine Audry Kauzlarich LCSW [phone]

## 2016-09-19 NOTE — Discharge Instructions (Signed)
Avoid using drugs or alcohol.   See your doctor.   See substance abuse centers for help   Return to ER if you have thoughts of harming yourself or others, hallucinations

## 2016-09-19 NOTE — ED Triage Notes (Signed)
Pt presents voluntarily. Pt tearful in triage. Pt reports having used meth in the past 24 hours. Pt reports having thoughts of wanting to hurt herself. Denies having a suicide plan. Pt reports hearing voices. Pt has old bruise noted to left forehead. Pt states she does not know how she got the bruise.

## 2016-09-19 NOTE — ED Provider Notes (Signed)
  Physical Exam  BP 127/90 (BP Location: Right Arm)   Pulse 94   Temp 98 F (36.7 C) (Oral)   Resp 16   Ht 4\' 11"  (1.499 m)   Wt 100 lb (45.4 kg)   SpO2 100%   BMI 20.20 kg/m   Physical Exam  ED Course  Procedures  MDM Dr. Toni Amendlapacs saw patient. Thought that her symptoms likely from substance abuse and she is feeling better. UDS + amphetamine. He contacted family, and they are comfortable to take her back. Will dc home. Dr. Toni Amendlapacs rescinded IVC.       Carolyn Panderavid Hsienta Yao, MD 09/19/16 [phone]

## 2016-09-19 NOTE — ED Notes (Addendum)
Dr clapaac in room with patient at this time

## 2016-09-19 NOTE — ED Notes (Signed)
Pt sleeping, food tray placed in room.  

## 2016-09-19 NOTE — ED Provider Notes (Signed)
[facility] Emergency Department Provider Note    First MD Initiated Contact with Patient 09/19/16 [phone]     (approximate)  I have reviewed the triage vital signs and the nursing notes.   HISTORY  Chief Complaint Psychiatric Evaluation    HPI Carolyn [name] is a 21 y.o. female presents to the ER stating that she's having thoughts of harming herself and thoughts of killing herself. Will not admit to a plan. Patient tearful.  Admits to doing mass in the past 24 hours.  Is that she is hearing voices that are telling her to kill herself but not telling her how. Since she had a history of depression. Has had suicidal thoughts in the past. States she seen a psychiatrist but could not tell me who. Is not this to be on any medications that she is aware of.   Past Medical History:  Diagnosis Date  . Allergy   . Asthma   . Depression   . Pneumonia    Family History  Problem Relation Age of Onset  . Hypertension Father    No past surgical history on file. Patient Active Problem List   Diagnosis Date Noted  . Normal labor and delivery 02/28/2016  . Irregular contractions 02/07/2016  . Hypertension in pregnancy, preeclampsia, severe, delivered 01/01/2015  . Postpartum care following vaginal delivery 12/31/2014  . Labor and delivery, indication for care 12/30/2014  . Abdominal pain 12/29/2014  . Labor and delivery indication for care or intervention 12/29/2014  . Braxton Hicks contractions 12/05/2014      Prior to Admission medications   Medication Sig Start Date End Date Taking? Authorizing Provider  albuterol (PROVENTIL HFA;VENTOLIN HFA) 108 (90 Base) MCG/ACT inhaler Inhale 2 puffs into the lungs every 6 (six) hours as needed for wheezing or shortness of breath. Patient not taking: Reported on 09/19/2016 09/05/16   Jene Everyobert Kinner, MD  ferrous sulfate (FERROUSUL) 325 (65 FE) MG tablet Take 1 tablet (325 mg total) by mouth 2 (two) times daily. Patient  not taking: Reported on 09/19/2016 03/01/16   Vena AustriaAndreas Staebler, MD  ibuprofen (ADVIL,MOTRIN) 600 MG tablet Take 1 tablet (600 mg total) by mouth every 6 (six) hours as needed for mild pain or moderate pain. Reported on 02/01/2016 Patient not taking: Reported on 09/19/2016 03/01/16   Vena AustriaAndreas Staebler, MD    Allergies Advair diskus [fluticasone-salmeterol]; Sulfa antibiotics; Prednisone; and Banana    Social History Social History  Substance Use Topics  . Smoking status: Never Smoker  . Smokeless tobacco: Never Used  . Alcohol use No    Review of Systems Patient denies headaches, rhinorrhea, blurry vision, numbness, shortness of breath, chest pain, edema, cough, abdominal pain, nausea, vomiting, diarrhea, dysuria, fevers, rashes or hallucinations unless otherwise stated above in HPI. ____________________________________________   PHYSICAL EXAM:  VITAL SIGNS: Vitals:   09/19/16 0836  BP: (!) 123/93  Pulse: 97  Resp: 15  Temp: 98.2 F (36.8 C)    Constitutional: Alert and oriented. Well appearing and in no acute distress. Eyes: Conjunctivae are normal. PERRL. EOMI. Head: Atraumatic. Nose: No congestion/rhinnorhea. Mouth/Throat: Mucous membranes are moist.  Oropharynx non-erythematous. Neck: No stridor. Painless ROM. No cervical spine tenderness to palpation Hematological/Lymphatic/Immunilogical: No cervical lymphadenopathy. Cardiovascular: Normal rate, regular rhythm. Grossly normal heart sounds.  Good peripheral circulation. Respiratory: Normal respiratory effort.  No retractions. Lungs CTAB. Gastrointestinal: Soft and nontender. No distention. No abdominal bruits. No CVA tenderness. Genitourinary:  Musculoskeletal: No lower extremity tenderness nor edema.  No joint effusions.  Neurologic:  Normal speech and language. No gross focal neurologic deficits are appreciated. No gait instability. Skin:  Skin is warm, dry and intact. No rash noted. Psychiatric: pressured speech,  tearful, lose associations ____________________________________________   LABS (all labs ordered are listed, but only abnormal results are displayed)  Results for orders placed or performed during the hospital encounter of 09/19/16 (from the past 24 hour(s))  Comprehensive metabolic panel     Status: Abnormal   Collection Time: 09/19/16  8:52 AM  Result Value Ref Range   Sodium 139 135 - 145 mmol/L   Potassium 4.0 3.5 - 5.1 mmol/L   Chloride 103 101 - 111 mmol/L   CO2 28 22 - 32 mmol/L   Glucose, Bld 111 (H) 65 - 99 mg/dL   BUN 8 6 - 20 mg/dL   Creatinine, Ser 9.60 0.44 - 1.00 mg/dL   Calcium 9.5 8.9 - 45.4 mg/dL   Total Protein 8.1 6.5 - 8.1 g/dL   Albumin 4.9 3.5 - 5.0 g/dL   AST 19 15 - 41 U/L   ALT 13 (L) 14 - 54 U/L   Alkaline Phosphatase 46 38 - 126 U/L   Total Bilirubin 0.5 0.3 - 1.2 mg/dL   GFR calc non Af Amer >60 >60 mL/min   GFR calc Af Amer >60 >60 mL/min   Anion gap 8 5 - 15  Ethanol     Status: None   Collection Time: 09/19/16  8:52 AM  Result Value Ref Range   Alcohol, Ethyl (B) <5 <5 mg/dL  Salicylate level     Status: None   Collection Time: 09/19/16  8:52 AM  Result Value Ref Range   Salicylate Lvl <7.0 2.8 - 30.0 mg/dL  Acetaminophen level     Status: Abnormal   Collection Time: 09/19/16  8:52 AM  Result Value Ref Range   Acetaminophen (Tylenol), Serum <10 (L) 10 - 30 ug/mL  cbc     Status: Abnormal   Collection Time: 09/19/16  8:52 AM  Result Value Ref Range   WBC 6.9 3.6 - 11.0 K/uL   RBC 5.23 (H) 3.80 - 5.20 MIL/uL   Hemoglobin 13.0 12.0 - 16.0 g/dL   HCT 09.8 11.9 - 14.7 %   MCV 77.5 (L) 80.0 - 100.0 fL   MCH 24.7 (L) 26.0 - 34.0 pg   MCHC 31.9 (L) 32.0 - 36.0 g/dL   RDW 82.9 (H) 56.2 - 13.0 %   Platelets 354 150 - 440 K/uL  Urine Drug Screen, Qualitative     Status: Abnormal   Collection Time: 09/19/16  9:24 AM  Result Value Ref Range   Tricyclic, Ur Screen NONE DETECTED NONE DETECTED   Amphetamines, Ur Screen POSITIVE (A) NONE DETECTED     MDMA (Ecstasy)Ur Screen NONE DETECTED NONE DETECTED   Cocaine Metabolite,Ur [place] NONE DETECTED NONE DETECTED   Opiate, Ur Screen NONE DETECTED NONE DETECTED   Phencyclidine (PCP) Ur S NONE DETECTED NONE DETECTED   Cannabinoid 50 Ng, Ur [place] NONE DETECTED NONE DETECTED   Barbiturates, Ur Screen NONE DETECTED NONE DETECTED   Benzodiazepine, Ur Scrn NONE DETECTED NONE DETECTED   Methadone Scn, Ur NONE DETECTED NONE DETECTED  Pregnancy, urine POC     Status: None   Collection Time: 09/19/16  9:34 AM  Result Value Ref Range   Preg Test, Ur NEGATIVE NEGATIVE   ____________________________________________  EKG____________________________________________  RADIOLOGY   ____________________________________________   PROCEDURES  Procedure(s) performed:  Procedures    Critical Care performed:  no ____________________________________________   INITIAL IMPRESSION / ASSESSMENT AND PLAN / ED COURSE  Pertinent labs & imaging results that were available during my care of the patient were reviewed by me and considered in my medical decision making (see chart for details).  DDX: Psychosis, delirium, medication effect, noncompliance, polysubstance abuse, Si, Hi, depression   Carolyn [name] is a 21 y.o. who presents to the ED with for evaluation of si.  Patient has psych history of depression and substance abuse.  Laboratory testing was ordered to evaluation for underlying electrolyte derangement or signs of underlying organic pathology to explain today's presentation.  Based on history and physical and laboratory evaluation, it appears that the patient's presentation is 2/2 underlying psychiatric disorder and will require further evaluation and management by inpatient psychiatry.  Patient was  made an IVC due to substance abuse, si and concern for mania.  Disposition pending psychiatric evaluation.       ____________________________________________   FINAL CLINICAL IMPRESSION(S) / ED  DIAGNOSES  Final diagnoses:  Suicidal ideation  Methamphetamine abuse      NEW MEDICATIONS STARTED DURING THIS VISIT:  New Prescriptions   No medications on file     Note:  This document was prepared using Dragon voice recognition software and may include unintentional dictation errors.     Willy Eddy, MD 09/19/16 1346

## 2016-09-19 NOTE — ED Notes (Signed)
Pt upset stating that she wants to leave and that her mom is going to come pick her up. Pt was informed of IVC status.

## 2017-08-30 ENCOUNTER — Emergency Department: Admission: EM | Admit: 2017-08-30 | Payer: Self-pay | Source: Home / Self Care

## 2017-08-30 NOTE — ED Notes (Signed)
Patient's mother approached the desk and stated that her daughter is leaving. Advised patient to stay but she ran out the door.

## 2017-12-12 DIAGNOSIS — B1920 Unspecified viral hepatitis C without hepatic coma: Secondary | ICD-10-CM

## 2017-12-12 HISTORY — DX: Unspecified viral hepatitis C without hepatic coma: B19.20
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
## 2017-12-21 ENCOUNTER — Encounter: Payer: Self-pay | Admitting: Physician Assistant

## 2017-12-21 ENCOUNTER — Ambulatory Visit: Payer: Self-pay | Admitting: Physician Assistant

## 2017-12-21 VITALS — BP 106/69 | HR 79 | Temp 98.1°F | Ht 59.75 in | Wt 125.5 lb

## 2017-12-21 DIAGNOSIS — Z833 Family history of diabetes mellitus: Secondary | ICD-10-CM

## 2017-12-21 DIAGNOSIS — Z7689 Persons encountering health services in other specified circumstances: Secondary | ICD-10-CM

## 2017-12-21 DIAGNOSIS — Z13220 Encounter for screening for lipoid disorders: Secondary | ICD-10-CM

## 2017-12-21 DIAGNOSIS — R768 Other specified abnormal immunological findings in serum: Secondary | ICD-10-CM

## 2017-12-21 DIAGNOSIS — Z131 Encounter for screening for diabetes mellitus: Secondary | ICD-10-CM

## 2017-12-21 DIAGNOSIS — F17210 Nicotine dependence, cigarettes, uncomplicated: Secondary | ICD-10-CM

## 2017-12-21 DIAGNOSIS — F1511 Other stimulant abuse, in remission: Secondary | ICD-10-CM

## 2017-12-21 DIAGNOSIS — Z862 Personal history of diseases of the blood and blood-forming organs and certain disorders involving the immune mechanism: Secondary | ICD-10-CM

## 2017-12-21 NOTE — Progress Notes (Signed)
BP 106/69 (BP Location: Right Arm, Patient Position: Sitting, Cuff Size: Normal)   Pulse 79   Temp 98.1 F (36.7 C)   Ht 4' 11.75" (1.518 m)   Wt 125 lb 8 oz (56.9 kg)   SpO2 97%   BMI 24.72 kg/m    Subjective:    Patient ID: Carolyn [name], female    DOB: 1996/04/22, 22 y.o.   MRN: [id number]  HPI: Carolyn [name] is a 21 y.o. female presenting on 12/21/2017 for New Patient (Initial Visit) (pt was released from jail on 12-04-2017. pt was sent letter from jail with dx of Hep C. pt resides at [facility]. previous patient at [facility] in [place], [place]. pt last seen there 11/2017)   HPI   Chief Complaint  Patient presents with  . New Patient (Initial Visit)    pt was released from jail on 12-04-2017. pt was sent letter from jail with dx of Hep C. pt resides at [facility]. previous patient at [facility] in [place], [place]. pt last seen there 11/2017    Pt says she was diagnosed with hepatitis C while she was incarcerated.  She has paperwork- it states HIV  negative, HCV positive, RPR negative  Pt c/o smelly discharge  Relevant past medical, surgical, family and social history reviewed and updated as indicated. Interim medical history since our last visit reviewed. Allergies and medications reviewed and updated.   Current Outpatient Medications:  .  albuterol (PROVENTIL HFA;VENTOLIN HFA) 108 (90 Base) MCG/ACT inhaler, Inhale 2 puffs into the lungs every 6 (six) hours as needed for wheezing or shortness of breath., Disp: 1 Inhaler, Rfl: 2 .  escitalopram (LEXAPRO) 10 MG tablet, Take 10 mg by mouth daily., Disp: , Rfl:  .  ibuprofen (ADVIL,MOTRIN) 600 MG tablet, Take 1 tablet (600 mg total) by mouth every 6 (six) hours as needed for mild pain or moderate pain. Reported on 02/01/2016, Disp: 30 tablet, Rfl: 0   Review of Systems  Constitutional: Negative for appetite change, chills, diaphoresis, fatigue, fever and unexpected weight change.  HENT: Negative for congestion, dental  problem, drooling, ear pain, facial swelling, hearing loss, mouth sores, sneezing, sore throat, trouble swallowing and voice change.   Eyes: Negative for pain, discharge, redness, itching and visual disturbance.  Respiratory: Negative for cough, choking, shortness of breath and wheezing.   Cardiovascular: Negative for chest pain, palpitations and leg swelling.  Gastrointestinal: Positive for abdominal pain. Negative for blood in stool, constipation, diarrhea and vomiting.  Endocrine: Negative for cold intolerance, heat intolerance and polydipsia.  Genitourinary: Negative for decreased urine volume, dysuria and hematuria.  Musculoskeletal: Negative for arthralgias, back pain and gait problem.  Skin: Negative for rash.  Allergic/Immunologic: Negative for environmental allergies.  Neurological: Negative for seizures, syncope, light-headedness and headaches.  Hematological: Negative for adenopathy.  Psychiatric/Behavioral: Positive for dysphoric mood. Negative for agitation and suicidal ideas. The patient is nervous/anxious.     Per HPI unless specifically indicated above     Objective:    BP 106/69 (BP Location: Right Arm, Patient Position: Sitting, Cuff Size: Normal)   Pulse 79   Temp 98.1 F (36.7 C)   Ht 4' 11.75" (1.518 m)   Wt 125 lb 8 oz (56.9 kg)   SpO2 97%   BMI 24.72 kg/m   Wt Readings from Last 3 Encounters:  12/21/17 125 lb 8 oz (56.9 kg)  12/14/17 125 lb (56.7 kg)  09/19/16 100 lb (45.4 kg)    Physical Exam  Constitutional: She  is oriented to person, place, and time. She appears well-developed and well-nourished.  HENT:  Head: Normocephalic and atraumatic.  Mouth/Throat: Oropharynx is clear and moist. No oropharyngeal exudate.  Eyes: Pupils are equal, round, and reactive to light. Conjunctivae and EOM are normal.  Neck: Neck supple. No thyromegaly present.  Cardiovascular: Normal rate and regular rhythm.  Pulmonary/Chest: Effort normal and breath sounds normal.   Abdominal: Soft. Bowel sounds are normal. She exhibits no mass. There is no hepatosplenomegaly. There is no tenderness.  Musculoskeletal: She exhibits no edema.  Lymphadenopathy:    She has no cervical adenopathy.  Neurological: She is alert and oriented to person, place, and time. Gait normal.  Skin: Skin is warm and dry.  Psychiatric: She has a normal mood and affect. Her behavior is normal.  Vitals reviewed.       Assessment & Plan:   Encounter Diagnoses  Name Primary?  . Encounter to establish care Yes  . 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 Screening cholesterol level   . History of anemia   . Family history of diabetes mellitus   . Screening for diabetes mellitus   . Hepatitis C antibody positive in blood   . Cigarette nicotine dependence without complication      -pt to get fasting Labs- hcv, lipid, cmp, a1c, h/h -will refer to GI for hepatitis after seeing labs -counseled to use condoms (discussed options and she is not wanting to use any other type of contraceptive at this time. ) -[facility] to [facility] for [facility] testing (due to smelly discharge) -pt to continue with [facility] for counseling and [facility] for rehab -pt to follow up here 1 month.  RTO sooner prn

## 2017-12-24 ENCOUNTER — Encounter: Payer: Self-pay | Admitting: Physician Assistant

## 2018-01-04 ENCOUNTER — Other Ambulatory Visit (HOSPITAL_COMMUNITY)
Admission: RE | Admit: 2018-01-04 | Discharge: 2018-01-04 | Disposition: A | Discharge status: Home / Self Care | Payer: Self-pay | Source: Ambulatory Visit | Attending: Physician Assistant | Admitting: Physician Assistant

## 2018-01-04 DIAGNOSIS — Z131 Encounter for screening for diabetes mellitus: Secondary | ICD-10-CM | POA: Insufficient documentation

## 2018-01-04 DIAGNOSIS — F1511 Other stimulant abuse, in remission: Secondary | ICD-10-CM | POA: Insufficient documentation

## 2018-01-04 DIAGNOSIS — Z13220 Encounter for screening for lipoid disorders: Secondary | ICD-10-CM | POA: Insufficient documentation

## 2018-01-04 DIAGNOSIS — Z833 Family history of diabetes mellitus: Secondary | ICD-10-CM | POA: Insufficient documentation

## 2018-01-04 DIAGNOSIS — Z862 Personal history of diseases of the blood and blood-forming organs and certain disorders involving the immune mechanism: Secondary | ICD-10-CM | POA: Insufficient documentation

## 2018-01-04 DIAGNOSIS — R768 Other specified abnormal immunological findings in serum: Secondary | ICD-10-CM | POA: Insufficient documentation

## 2018-01-04 LAB — HEMOGLOBIN AND HEMATOCRIT, BLOOD
HCT: 38.3 % (ref 36.0–46.0)
HEMOGLOBIN: 12.5 g/dL (ref 12.0–15.0)

## 2018-01-04 LAB — COMPREHENSIVE METABOLIC PANEL
ALBUMIN: 4.1 g/dL (ref 3.5–5.0)
ALT: 31 U/L (ref 14–54)
AST: 23 U/L (ref 15–41)
Alkaline Phosphatase: 45 U/L (ref 38–126)
Anion gap: 6 (ref 5–15)
BILIRUBIN TOTAL: 0.5 mg/dL (ref 0.3–1.2)
BUN: 8 mg/dL (ref 6–20)
CALCIUM: 9 mg/dL (ref 8.9–10.3)
CO2: 29 mmol/L (ref 22–32)
CREATININE: 0.56 mg/dL (ref 0.44–1.00)
Chloride: 104 mmol/L (ref 101–111)
GFR calc Af Amer: >60 mL/min (ref >60)
GFR calc non Af Amer: >60 mL/min (ref >60)
GLUCOSE: 88 mg/dL (ref 65–99)
POTASSIUM: 3.8 mmol/L (ref 3.5–5.1)
Sodium: 139 mmol/L (ref 135–145)
TOTAL PROTEIN: 7.3 g/dL (ref 6.5–8.1)

## 2018-01-04 LAB — LIPID PANEL
CHOLESTEROL: 205 mg/dL — AB (ref 0–200)
HDL: 48 mg/dL (ref >40)
LDL CALC: 137 mg/dL — AB (ref 0–99)
Total CHOL/HDL Ratio: 4.3 RATIO
Triglycerides: 99 mg/dL (ref <150)
VLDL: 20 mg/dL (ref 0–40)

## 2018-01-04 LAB — HEMOGLOBIN A1C
Hgb A1c MFr Bld: 5.2 % (ref 4.8–5.6)
Mean Plasma Glucose: 102.54 mg/dL

## 2018-01-06 LAB — HCV RNA QUANT
HCV QUANT: 1230 [IU]/mL (ref >50)
HCV Quantitative Log: 3.09 log10 IU/mL (ref >1.70)

## 2018-01-10 ENCOUNTER — Other Ambulatory Visit: Payer: Self-pay

## 2018-01-10 ENCOUNTER — Emergency Department
Admission: EM | Admit: 2018-01-10 | Discharge: 2018-01-10 | Disposition: A | Discharge status: Home / Self Care | Payer: Self-pay | Attending: Emergency Medicine | Admitting: Emergency Medicine

## 2018-01-10 DIAGNOSIS — N390 Urinary tract infection, site not specified: Secondary | ICD-10-CM | POA: Insufficient documentation

## 2018-01-10 DIAGNOSIS — F17210 Nicotine dependence, cigarettes, uncomplicated: Secondary | ICD-10-CM | POA: Insufficient documentation

## 2018-01-10 DIAGNOSIS — J45909 Unspecified asthma, uncomplicated: Secondary | ICD-10-CM | POA: Insufficient documentation

## 2018-01-10 DIAGNOSIS — Z79899 Other long term (current) drug therapy: Secondary | ICD-10-CM | POA: Insufficient documentation

## 2018-01-10 LAB — URINALYSIS, COMPLETE (UACMP) WITH MICROSCOPIC
GLUCOSE, UA: NEGATIVE mg/dL
Hgb urine dipstick: NEGATIVE
KETONES UR: 80 mg/dL — AB
Nitrite: NEGATIVE
PROTEIN: 30 mg/dL — AB
Specific Gravity, Urine: 1.029 (ref 1.005–1.030)
pH: 5 (ref 5.0–8.0)

## 2018-01-10 LAB — CBC
HEMATOCRIT: 38.2 % (ref 35.0–47.0)
HEMOGLOBIN: 13.2 g/dL (ref 12.0–16.0)
MCH: 29.2 pg (ref 26.0–34.0)
MCHC: 34.5 g/dL (ref 32.0–36.0)
MCV: 84.5 fL (ref 80.0–100.0)
Platelets: 334 10*3/uL (ref 150–440)
RBC: 4.52 MIL/uL (ref 3.80–5.20)
RDW: 14.2 % (ref 11.5–14.5)
WBC: 7.9 10*3/uL (ref 3.6–11.0)

## 2018-01-10 LAB — COMPREHENSIVE METABOLIC PANEL
ALBUMIN: 4.8 g/dL (ref 3.5–5.0)
ALT: 25 U/L (ref 0–44)
ANION GAP: 11 (ref 5–15)
AST: 34 U/L (ref 15–41)
Alkaline Phosphatase: 48 U/L (ref 38–126)
BUN: 17 mg/dL (ref 6–20)
CO2: 23 mmol/L (ref 22–32)
Calcium: 8.9 mg/dL (ref 8.9–10.3)
Chloride: 104 mmol/L (ref 98–111)
Creatinine, Ser: 0.73 mg/dL (ref 0.44–1.00)
GFR calc Af Amer: >60 mL/min (ref >60)
GFR calc non Af Amer: >60 mL/min (ref >60)
GLUCOSE: 88 mg/dL (ref 70–99)
POTASSIUM: 3.2 mmol/L — AB (ref 3.5–5.1)
Sodium: 138 mmol/L (ref 135–145)
Total Bilirubin: 1.5 mg/dL — ABNORMAL HIGH (ref 0.3–1.2)
Total Protein: 7.9 g/dL (ref 6.5–8.1)

## 2018-01-10 LAB — LIPASE, BLOOD: Lipase: 25 U/L (ref 11–51)

## 2018-01-10 LAB — POCT PREGNANCY, URINE: PREG TEST UR: NEGATIVE

## 2018-01-10 MED ORDER — AMOXICILLIN 500 MG PO TABS: 500.0000 mg | ORAL_TABLET | Freq: Two times a day (BID) | ORAL | 0 refills | Status: DC

## 2018-01-10 NOTE — ED Triage Notes (Signed)
Patient reports she left a treatment center a few days ago and has been "on the streets" since then.  Patient was at treatment center for meth, last used meth a few days ago.

## 2018-01-10 NOTE — ED Notes (Signed)
Pt's visitor up to stat desk asking if a urine drug screen was ordered on pt; explained to visitor that we've placed orders for our protocols according to pt's chief complaint and a UDS is not one of those orders; visitor said "well it should be"-says pt just got kicked out of rehab and "her eyes are big"; again explained to visitor that we have not placed that order according to protocols that we're allowed to perform; explained that the MD she sees could add it if they choose; spoke with Dr Zenda AlpersWebster; she does not want a UDS ordered at this time;
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
## 2018-01-10 NOTE — ED Notes (Signed)
ED Provider at bedside. 

## 2018-01-10 NOTE — ED Notes (Signed)
Visitor up to desk stating "I'm taking Carolyn [name] with me", "looks like she's not going to be seen any time soon"; pt noted to already be walking out the front door with suitcase in tow; explained to visitor that pt had some results that are slightly abnormal that may need to be addressed by provider; visitor says she will have pt return; both ladies back to waiting room;

## 2018-01-10 NOTE — Discharge Instructions (Signed)
You have been seen in the Emergency Department (ED) today for pain when urinating.  Your workup today suggests that you have a urinary tract infection (UTI). ° °Please take your antibiotic as prescribed. ° °Call your regular doctor to schedule the next available appointment to follow up on today?s ED visit, or return immediately to the ED if your pain worsens, you have decreased urine production, develop fever, persistent vomiting, or other symptoms that concern you. ° °
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
## 2018-01-20 ENCOUNTER — Ambulatory Visit: Payer: Self-pay | Admitting: Physician Assistant

## 2018-01-25 ENCOUNTER — Encounter: Payer: Self-pay | Admitting: Physician Assistant

## 2018-02-24 ENCOUNTER — Other Ambulatory Visit: Payer: Self-pay

## 2018-02-24 ENCOUNTER — Encounter (HOSPITAL_COMMUNITY): Payer: Self-pay | Admitting: Emergency Medicine

## 2018-02-24 ENCOUNTER — Emergency Department (HOSPITAL_COMMUNITY)
Admission: EM | Admit: 2018-02-24 | Discharge: 2018-02-24 | Disposition: A | Discharge status: Home / Self Care | Payer: Self-pay | Attending: Emergency Medicine | Admitting: Emergency Medicine

## 2018-02-24 DIAGNOSIS — J45909 Unspecified asthma, uncomplicated: Secondary | ICD-10-CM | POA: Insufficient documentation

## 2018-02-24 DIAGNOSIS — F17210 Nicotine dependence, cigarettes, uncomplicated: Secondary | ICD-10-CM | POA: Insufficient documentation

## 2018-02-24 DIAGNOSIS — N390 Urinary tract infection, site not specified: Secondary | ICD-10-CM | POA: Insufficient documentation

## 2018-02-24 DIAGNOSIS — Z79899 Other long term (current) drug therapy: Secondary | ICD-10-CM | POA: Insufficient documentation

## 2018-02-24 HISTORY — DX: Disorder of kidney and ureter, unspecified: N28.9

## 2018-02-24 LAB — URINALYSIS, ROUTINE W REFLEX MICROSCOPIC
BILIRUBIN URINE: NEGATIVE
Glucose, UA: NEGATIVE mg/dL
Hgb urine dipstick: NEGATIVE
KETONES UR: NEGATIVE mg/dL
Nitrite: NEGATIVE
PH: 6 (ref 5.0–8.0)
Protein, ur: NEGATIVE mg/dL
SPECIFIC GRAVITY, URINE: 1.012 (ref 1.005–1.030)

## 2018-02-24 LAB — PREGNANCY, URINE: PREG TEST UR: NEGATIVE

## 2018-02-24 MED ORDER — CEPHALEXIN 500 MG PO CAPS: 500.0000 mg | ORAL_CAPSULE | Freq: Four times a day (QID) | ORAL | 0 refills | Status: DC

## 2018-02-24 MED ORDER — CEPHALEXIN 500 MG PO CAPS
500.0000 mg | ORAL_CAPSULE | Freq: Once | ORAL | Status: AC
  Administered 2018-02-24: 500 mg via ORAL
  Filled 2018-02-24: qty 1

## 2018-02-24 NOTE — ED Triage Notes (Signed)
Pt c/o urinary freq, lower suprapubic pressure. Just had UTI x 1 month ago.

## 2018-02-24 NOTE — Discharge Instructions (Addendum)
Take the antibiotic as directed until its finished.  Drink plenty of water.  Follow-up with your primary provider or return to the ER for any worsening symptoms such as pain in your back, fever, or vomiting.

## 2018-02-25 LAB — URINE CULTURE

## 2018-02-25 NOTE — ED Provider Notes (Signed)
[facility] EMERGENCY DEPARTMENT Provider Note   CSN: [id number] Arrival date & time: 02/24/18  1800     History   Chief Complaint Chief Complaint  Patient presents with  . Urinary Frequency    HPI Carolyn [name] is a 22 y.o. female.  HPI   Carolyn [name] is a 22 y.o. female who presents to the Emergency Department complaining of urinary frequency and lower abdominal pressure.  Symptoms have been present for 1 to 2 days.  She reports history of frequent UTIs and was last treated 1 month ago.  She took amoxicillin for her previous infection, but states that her symptoms may not have fully resolved.  She does report intermittent urinary hesitancy.  She denies back pain, fever, vomiting, chills, abnormal vaginal bleeding or abnormal vaginal discharge   Past Medical History:  Diagnosis Date  . Allergy   . Anxiety   . Asthma    childhood  . Depression   . Hepatitis C 12/2017  . Pneumonia   . Renal disorder     Patient Active Problem List   Diagnosis Date Noted  . 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 Normal labor and delivery 02/28/2016  . Irregular contractions 02/07/2016  . Hypertension in pregnancy, preeclampsia, severe, delivered 01/01/2015  . Postpartum care following vaginal delivery 12/31/2014  . Labor and delivery, indication for care 12/30/2014  . Abdominal pain 12/29/2014  . Labor and delivery indication for care or intervention 12/29/2014  . Braxton Hicks contractions 12/05/2014    History reviewed. No pertinent surgical history.   OB History    Gravida  2   Para  2   Term  2   Preterm      AB      Living  2     SAB      TAB      Ectopic      Multiple  0   Live Births  2            Home Medications    Prior to Admission medications   Medication Sig Start Date End Date Taking? Authorizing Provider  albuterol (PROVENTIL  HFA;VENTOLIN HFA) 108 (90 Base) MCG/ACT inhaler Inhale 2 puffs into the lungs every 6 (six) hours as needed for wheezing or shortness of breath. 09/05/16   Jene EveryKinner, Robert, MD  amoxicillin (AMOXIL) 500 MG tablet Take 1 tablet (500 mg total) by mouth 2 (two) times daily. 01/10/18   Sharyn CreamerQuale, Mark, MD  cephALEXin (KEFLEX) 500 MG capsule Take 1 capsule (500 mg total) by mouth 4 (four) times daily. For 7 days 02/24/18   Dael Howland, PA-C  escitalopram (LEXAPRO) 10 MG tablet Take 10 mg by mouth daily.    [provider]  ibuprofen (ADVIL,MOTRIN) 600 MG tablet Take 1 tablet (600 mg total) by mouth every 6 (six) hours as needed for mild pain or moderate pain. Reported on 02/01/2016 03/01/16   Vena AustriaStaebler, Andreas, MD    Family History Family History  Problem Relation Age of Onset  . Hypertension Father   . Diabetes Father     Social History Social History   Tobacco Use  . Smoking status: Current Every Day Smoker    Packs/day: 1.00    Years: 7.00    Pack years: 7.00    Types: Cigarettes  . Smokeless tobacco: Never Used  Substance Use Topics  . Alcohol use: Not Currently    Alcohol/week: 4.0 standard drinks  Types: 4 Cans of beer per week    Comment: never heavy drinker  . Drug use: Yes    Types: Methamphetamines     Allergies   Advair diskus [fluticasone-salmeterol]; Sulfa antibiotics; Prednisone; and Banana   Review of Systems Review of Systems  Constitutional: Negative for activity change, appetite change, chills and fever.  Respiratory: Negative for chest tightness and shortness of breath.   Gastrointestinal: Positive for abdominal pain (Lower abdominal pressure with urination). Negative for nausea and vomiting.  Genitourinary: Positive for dysuria. Negative for decreased urine volume, difficulty urinating, flank pain, frequency, hematuria, urgency, vaginal bleeding and vaginal discharge.  Musculoskeletal: Negative for back pain.  Skin: Negative for rash.  Neurological:  Negative for dizziness, weakness and numbness.  Hematological: Negative for adenopathy.  All other systems reviewed and are negative.    Physical Exam Updated Vital Signs BP 115/62   Pulse 62   Temp 98.2 F (36.8 C) (Oral)   Resp 15   LMP 01/13/2018   SpO2 100%   Physical Exam  Constitutional: She appears well-developed and well-nourished. No distress.  HENT:  Head: Normocephalic.  Cardiovascular: Normal rate, regular rhythm and intact distal pulses.  No murmur heard. Pulmonary/Chest: Effort normal and breath sounds normal. No respiratory distress. She has no wheezes. She has no rales.  Abdominal: Soft. Normal appearance. She exhibits no distension and no mass. There is no hepatosplenomegaly. There is tenderness in the suprapubic area. There is no rigidity, no rebound, no guarding, no CVA tenderness and no tenderness at McBurney's point.  Mild ttp of the suprapubic region.  Remaining abdomen is soft, non-tender without guarding or rebound tenderness. No CVA tenderness  Musculoskeletal: Normal range of motion. She exhibits no edema.  Neurological: She is alert. No sensory deficit.  Skin: Skin is warm and dry. No rash noted.  Psychiatric: She has a normal mood and affect.  Nursing note and vitals reviewed.    ED Treatments / Results  Labs (all labs ordered are listed, but only abnormal results are displayed) Labs Reviewed  URINALYSIS, ROUTINE W REFLEX MICROSCOPIC - Abnormal; Notable for the following components:      Result Value   APPearance CLOUDY (*)    Leukocytes, UA LARGE (*)    Bacteria, UA FEW (*)    All other components within normal limits  URINE CULTURE  PREGNANCY, URINE    EKG None  Radiology No results found.  Procedures Procedures (including critical care time)  Medications Ordered in ED Medications  cephALEXin (KEFLEX) capsule 500 mg (500 mg Oral Given 02/24/18 1916)     Initial Impression / Assessment and Plan / ED Course  I have reviewed the  triage vital signs and the nursing notes.  Pertinent labs & imaging results that were available during my care of the patient were reviewed by me and considered in my medical decision making (see chart for details).     Patient with dysuria symptoms and history of frequent UTIs.  Urinalysis shows leukocytes and few bacteria.  On review of previous medical records.  She was seen in June and diagnosed with UTI and treated with Amoxil.  No urine culture obtained at that time.  No concerning symptoms for pyelonephritis or kidney stone currently.  Will treat with Keflex and urine culture pending.  She appears safe for discharge home agrees to close outpatient follow-up return precautions were discussed.  Final Clinical Impressions(s) / ED Diagnoses   Final diagnoses:  Urinary tract infection in female    ED  Discharge Orders         Ordered    cephALEXin (KEFLEX) 500 MG capsule  4 times daily     02/24/18 1916           Pauline Ausriplett, Vonya Ohalloran, Cordelia Poche-C 02/25/18 1256    Donnetta Hutchingook, Brian, MD 03/01/18 1112

## 2018-03-07 ENCOUNTER — Emergency Department (HOSPITAL_COMMUNITY)
Admission: EM | Admit: 2018-03-07 | Discharge: 2018-03-07 | Disposition: A | Discharge status: Home / Self Care | Payer: Self-pay | Attending: Emergency Medicine | Admitting: Emergency Medicine

## 2018-03-07 ENCOUNTER — Other Ambulatory Visit: Payer: Self-pay

## 2018-03-07 ENCOUNTER — Encounter (HOSPITAL_COMMUNITY): Payer: Self-pay | Admitting: Emergency Medicine

## 2018-03-07 DIAGNOSIS — F17210 Nicotine dependence, cigarettes, uncomplicated: Secondary | ICD-10-CM | POA: Insufficient documentation

## 2018-03-07 DIAGNOSIS — F329 Major depressive disorder, single episode, unspecified: Secondary | ICD-10-CM | POA: Insufficient documentation

## 2018-03-07 DIAGNOSIS — F419 Anxiety disorder, unspecified: Secondary | ICD-10-CM | POA: Insufficient documentation

## 2018-03-07 DIAGNOSIS — J45909 Unspecified asthma, uncomplicated: Secondary | ICD-10-CM | POA: Insufficient documentation

## 2018-03-07 DIAGNOSIS — N390 Urinary tract infection, site not specified: Secondary | ICD-10-CM | POA: Insufficient documentation

## 2018-03-07 LAB — URINALYSIS, ROUTINE W REFLEX MICROSCOPIC
Bilirubin Urine: NEGATIVE
GLUCOSE, UA: NEGATIVE mg/dL
Hgb urine dipstick: NEGATIVE
Ketones, ur: NEGATIVE mg/dL
NITRITE: NEGATIVE
PH: 5 (ref 5.0–8.0)
PROTEIN: NEGATIVE mg/dL
Specific Gravity, Urine: 1.02 (ref 1.005–1.030)

## 2018-03-07 LAB — POC URINE PREG, ED: Preg Test, Ur: NEGATIVE

## 2018-03-07 MED ORDER — CIPROFLOXACIN HCL 500 MG PO TABS: 500.0000 mg | ORAL_TABLET | Freq: Two times a day (BID) | ORAL | 0 refills | Status: DC

## 2018-03-07 NOTE — ED Triage Notes (Signed)
Pt reports R sided back pain that started a few days ago. Several weeks ago she had a UTI, finished all antibx, has not had urine retested. Denies GU complaints. Hx of lower back pain.

## 2018-03-08 NOTE — ED Provider Notes (Signed)
[facility] EMERGENCY DEPARTMENT Provider Note   CSN: [id number] Arrival date & time: 03/07/18  4098     History   Chief Complaint Chief Complaint  Patient presents with  . Back Pain    HPI Carolyn [name] is a 22 y.o. female.  The history is provided by the patient. No language interpreter was used.  Dysuria   This is a new problem. The current episode started yesterday. The problem has been gradually worsening. The quality of the pain is described as burning. The pain is moderate. There has been no fever. Associated symptoms include flank pain. She has tried nothing for the symptoms. Her past medical history is significant for recurrent UTIs.  Pt complains of low back pain.  Pt reports she recently had a uti  Past Medical History:  Diagnosis Date  . Allergy   . Anxiety   . Asthma    childhood  . Depression   . Hepatitis C 12/2017  . Pneumonia   . Renal disorder     Patient Active Problem List   Diagnosis Date Noted  . 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 Normal labor and delivery 02/28/2016  . Irregular contractions 02/07/2016  . Hypertension in pregnancy, preeclampsia, severe, delivered 01/01/2015  . Postpartum care following vaginal delivery 12/31/2014  . Labor and delivery, indication for care 12/30/2014  . Abdominal pain 12/29/2014  . Labor and delivery indication for care or intervention 12/29/2014  . Braxton Hicks contractions 12/05/2014    History reviewed. No pertinent surgical history.   OB History    Gravida  2   Para  2   Term  2   Preterm      AB      Living  2     SAB      TAB      Ectopic      Multiple  0   Live Births  2            Home Medications    Prior to Admission medications   Medication Sig Start Date End Date Taking? Authorizing Provider  acetaminophen (TYLENOL) 500 MG tablet Take 1,000 mg by mouth every 6 (six) hours as  needed for mild pain.   Yes [provider]  cephALEXin (KEFLEX) 500 MG capsule Take 1 capsule (500 mg total) by mouth 4 (four) times daily. For 7 days Patient not taking: Reported on 03/07/2018 02/24/18   Pauline Aus, PA-C  ciprofloxacin (CIPRO) 500 MG tablet Take 1 tablet (500 mg total) by mouth 2 (two) times daily. 03/07/18   Elson Areas, PA-C    Family History Family History  Problem Relation Age of Onset  . Hypertension Father   . Diabetes Father     Social History Social History   Tobacco Use  . Smoking status: Current Every Day Smoker    Packs/day: 1.00    Years: 7.00    Pack years: 7.00    Types: Cigarettes  . Smokeless tobacco: Never Used  Substance Use Topics  . Alcohol use: Not Currently    Alcohol/week: 4.0 standard drinks    Types: 4 Cans of beer per week    Comment: never heavy drinker  . Drug use: Yes    Types: Methamphetamines     Allergies   Advair diskus [fluticasone-salmeterol]; Sulfa antibiotics; Prednisone; and Banana   Review of Systems Review of Systems  Genitourinary: Positive for dysuria and flank pain.  All other systems reviewed and are negative.    Physical Exam Updated Vital Signs BP 109/76 (BP Location: Left Arm)   Pulse 81   Temp 98.3 F (36.8 C) (Oral)   Resp 16   Wt 54.4 kg   LMP 03/01/2018   SpO2 100%   BMI 24.24 kg/m   Physical Exam  Constitutional: She appears well-developed and well-nourished.  HENT:  Head: Normocephalic.  Eyes: Pupils are equal, round, and reactive to light.  Neck: Normal range of motion.  Cardiovascular: Normal rate and regular rhythm.  Pulmonary/Chest: Effort normal and breath sounds normal.  Abdominal: Soft.  Musculoskeletal: Normal range of motion.  Neurological: She is alert.  Skin: Skin is warm.  Psychiatric: She has a normal mood and affect.  Nursing note and vitals reviewed.    ED Treatments / Results  Labs (all labs ordered are listed, but only abnormal results are  displayed) Labs Reviewed  URINALYSIS, ROUTINE W REFLEX MICROSCOPIC - Abnormal; Notable for the following components:      Result Value   APPearance HAZY (*)    Leukocytes, UA SMALL (*)    Bacteria, UA RARE (*)    All other components within normal limits  POC URINE PREG, ED    EKG None  Radiology No results found.  Procedures Procedures (including critical care time)  Medications Ordered in ED Medications - No data to display   Initial Impression / Assessment and Plan / ED Course  I have reviewed the triage vital signs and the nursing notes.  Pertinent labs & imaging results that were available during my care of the patient were reviewed by me and considered in my medical decision making (see chart for details).     Pt counseled on uti.  Pt was recently on keflex.  I will culture urine and try cipro  Final Clinical Impressions(s) / ED Diagnoses   Final diagnoses:  Urinary tract infection without hematuria, site unspecified    ED Discharge Orders         Ordered    ciprofloxacin (CIPRO) 500 MG tablet  2 times daily     03/07/18 [address]1133           Trammell Bowden K, [place]PA-C 03/08/18 [id number]    Donnetta Hutchingook, Brian, MD 03/08/18 [phone]

## 2018-09-20 ENCOUNTER — Other Ambulatory Visit: Payer: Self-pay

## 2018-09-20 ENCOUNTER — Encounter (HOSPITAL_COMMUNITY): Payer: Self-pay | Admitting: Emergency Medicine

## 2018-09-20 ENCOUNTER — Emergency Department (HOSPITAL_COMMUNITY): Payer: Self-pay

## 2018-09-20 ENCOUNTER — Emergency Department (HOSPITAL_COMMUNITY)
Admission: EM | Admit: 2018-09-20 | Discharge: 2018-09-20 | Disposition: A | Discharge status: Home / Self Care | Payer: Self-pay | Attending: Emergency Medicine | Admitting: Emergency Medicine

## 2018-09-20 DIAGNOSIS — Y998 Other external cause status: Secondary | ICD-10-CM | POA: Insufficient documentation

## 2018-09-20 DIAGNOSIS — J45909 Unspecified asthma, uncomplicated: Secondary | ICD-10-CM | POA: Insufficient documentation

## 2018-09-20 DIAGNOSIS — S80211A Abrasion, right knee, initial encounter: Secondary | ICD-10-CM | POA: Insufficient documentation

## 2018-09-20 DIAGNOSIS — F17210 Nicotine dependence, cigarettes, uncomplicated: Secondary | ICD-10-CM | POA: Insufficient documentation

## 2018-09-20 DIAGNOSIS — W19XXXA Unspecified fall, initial encounter: Secondary | ICD-10-CM

## 2018-09-20 DIAGNOSIS — S90512A Abrasion, left ankle, initial encounter: Secondary | ICD-10-CM | POA: Insufficient documentation

## 2018-09-20 DIAGNOSIS — S0083XA Contusion of other part of head, initial encounter: Secondary | ICD-10-CM | POA: Insufficient documentation

## 2018-09-20 DIAGNOSIS — S60511A Abrasion of right hand, initial encounter: Secondary | ICD-10-CM | POA: Insufficient documentation

## 2018-09-20 DIAGNOSIS — W1789XA Other fall from one level to another, initial encounter: Secondary | ICD-10-CM | POA: Insufficient documentation

## 2018-09-20 DIAGNOSIS — T148XXA Other injury of unspecified body region, initial encounter: Secondary | ICD-10-CM

## 2018-09-20 DIAGNOSIS — Y929 Unspecified place or not applicable: Secondary | ICD-10-CM | POA: Insufficient documentation

## 2018-09-20 DIAGNOSIS — S60512A Abrasion of left hand, initial encounter: Secondary | ICD-10-CM | POA: Insufficient documentation

## 2018-09-20 DIAGNOSIS — Y9389 Activity, other specified: Secondary | ICD-10-CM | POA: Insufficient documentation

## 2018-09-20 DIAGNOSIS — S0093XA Contusion of unspecified part of head, initial encounter: Secondary | ICD-10-CM

## 2018-09-20 MED ORDER — BACITRACIN ZINC 500 UNIT/GM EX OINT
TOPICAL_OINTMENT | Freq: Two times a day (BID) | CUTANEOUS | Status: DC
  Administered 2018-09-20: 2 via TOPICAL
  Filled 2018-09-20: qty 1.8

## 2018-09-20 NOTE — Discharge Instructions (Addendum)
Wash your scrapes daily with soap and water.  Apply antibiotic ointment such as Neosporin or triple antibiotic ointment to help prevent infection. Use Tylenol, ibuprofen, and ice to help with your headache and pain. You will likely have continued muscle stiffness and soreness over the next several days.  This will likely get worse before it gets better. Return to the emergency room if you develop recurrent seizures, vision loss, severe worsening headache, vomiting, loss of bowel bladder control, numbness, tingling. Return with any new, worsening, or concerning symptoms.
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
## 2018-09-20 NOTE — ED Notes (Signed)
Pt left the unit by walking out of the EMS bay. Pt is up for discharge.
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
## 2018-11-05 ENCOUNTER — Encounter (HOSPITAL_COMMUNITY): Payer: Self-pay

## 2018-11-05 ENCOUNTER — Emergency Department (HOSPITAL_COMMUNITY): Payer: Self-pay

## 2018-11-05 ENCOUNTER — Emergency Department (HOSPITAL_COMMUNITY)
Admission: EM | Admit: 2018-11-05 | Discharge: 2018-11-06 | Disposition: A | Discharge status: Other Acute Inpatient Hospital | Payer: Self-pay | Attending: Emergency Medicine | Admitting: Emergency Medicine

## 2018-11-05 DIAGNOSIS — F1510 Other stimulant abuse, uncomplicated: Secondary | ICD-10-CM | POA: Diagnosis present

## 2018-11-05 DIAGNOSIS — Z7289 Other problems related to lifestyle: Secondary | ICD-10-CM | POA: Insufficient documentation

## 2018-11-05 DIAGNOSIS — Z23 Encounter for immunization: Secondary | ICD-10-CM | POA: Insufficient documentation

## 2018-11-05 DIAGNOSIS — F17210 Nicotine dependence, cigarettes, uncomplicated: Secondary | ICD-10-CM | POA: Insufficient documentation

## 2018-11-05 DIAGNOSIS — Z046 Encounter for general psychiatric examination, requested by authority: Secondary | ICD-10-CM | POA: Insufficient documentation

## 2018-11-05 DIAGNOSIS — J45909 Unspecified asthma, uncomplicated: Secondary | ICD-10-CM | POA: Insufficient documentation

## 2018-11-05 DIAGNOSIS — F332 Major depressive disorder, recurrent severe without psychotic features: Secondary | ICD-10-CM | POA: Diagnosis present

## 2018-11-05 DIAGNOSIS — T50901A Poisoning by unspecified drugs, medicaments and biological substances, accidental (unintentional), initial encounter: Secondary | ICD-10-CM

## 2018-11-05 DIAGNOSIS — R45851 Suicidal ideations: Secondary | ICD-10-CM | POA: Insufficient documentation

## 2018-11-05 HISTORY — DX: Poisoning by unspecified drugs, medicaments and biological substances, accidental (unintentional), initial encounter: T50.901A

## 2018-11-05 LAB — I-STAT BETA HCG BLOOD, ED (MC, WL, AP ONLY): I-stat hCG, quantitative: <5 m[IU]/mL (ref <5)

## 2018-11-05 LAB — CBC
HCT: 39.8 % (ref 36.0–46.0)
Hemoglobin: 13 g/dL (ref 12.0–15.0)
MCH: 29.1 pg (ref 26.0–34.0)
MCHC: 32.7 g/dL (ref 30.0–36.0)
MCV: 89.2 fL (ref 80.0–100.0)
Platelets: 225 10*3/uL (ref 150–400)
RBC: 4.46 MIL/uL (ref 3.87–5.11)
RDW: 12.8 % (ref 11.5–15.5)
WBC: 5.3 10*3/uL (ref 4.0–10.5)
nRBC: 0 % (ref 0.0–0.2)

## 2018-11-05 LAB — COMPREHENSIVE METABOLIC PANEL
ALT: 95 U/L — ABNORMAL HIGH (ref 0–44)
AST: 124 U/L — ABNORMAL HIGH (ref 15–41)
Albumin: 4.2 g/dL (ref 3.5–5.0)
Alkaline Phosphatase: 45 U/L (ref 38–126)
Anion gap: 11 (ref 5–15)
BUN: 7 mg/dL (ref 6–20)
CO2: 22 mmol/L (ref 22–32)
Calcium: 8.9 mg/dL (ref 8.9–10.3)
Chloride: 105 mmol/L (ref 98–111)
Creatinine, Ser: 0.88 mg/dL (ref 0.44–1.00)
GFR calc Af Amer: >60 mL/min (ref >60)
GFR calc non Af Amer: >60 mL/min (ref >60)
Glucose, Bld: 107 mg/dL — ABNORMAL HIGH (ref 70–99)
Potassium: 3.7 mmol/L (ref 3.5–5.1)
Sodium: 138 mmol/L (ref 135–145)
Total Bilirubin: 0.8 mg/dL (ref 0.3–1.2)
Total Protein: 6.9 g/dL (ref 6.5–8.1)

## 2018-11-05 LAB — ACETAMINOPHEN LEVEL: Acetaminophen (Tylenol), Serum: <10 ug/mL — ABNORMAL LOW (ref 10–30)

## 2018-11-05 LAB — SALICYLATE LEVEL: Salicylate Lvl: <7 mg/dL (ref 2.8–30.0)

## 2018-11-05 LAB — ETHANOL: Alcohol, Ethyl (B): 15 mg/dL — ABNORMAL HIGH (ref <10)

## 2018-11-05 MED ORDER — TETANUS-DIPHTH-ACELL PERTUSSIS 5-2.5-18.5 LF-MCG/0.5 IM SUSP
0.5000 mL | Freq: Once | INTRAMUSCULAR | Status: AC
  Administered 2018-11-06: 06:00:00 0.5 mL via INTRAMUSCULAR
  Filled 2018-11-05: qty 0.5

## 2018-11-05 NOTE — ED Notes (Signed)
Nurse Navigator brought pt some food
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
## 2018-11-05 NOTE — ED Notes (Signed)
Nurse Navigator checking on pt.  Pt would like to call contact who was going to help her find a safe place to stay.  Updated pt that no visitors are allowed.  Pt has sitter at the bedside, attempted to update pt and reassure her
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
## 2018-11-05 NOTE — ED Notes (Signed)
Pt ambulated to Purple room 49, with a steady gait, no difficulty noted. Upon interacting with the pt the first time, this EMT and Monique, RN reminded the pt we still needed a urine sample. Pt was informed it was for a drug screen that is part of our protocol and so we can best take care of her. Pt was adamant that she did not want to provide a sample.   Pt is now calm, cooperative, and resting with sitter remaining at bedside in Purple zone.   Pts belongings were inventoried by Gabriel Rung, Charity fundraiser but placed in locker #2 by this EMT.

## 2018-11-05 NOTE — ED Provider Notes (Addendum)
[facility] EMERGENCY DEPARTMENT Provider Note   CSN: [id number] Arrival date & time: 11/05/18  1953    History   Chief Complaint Chief Complaint  Patient presents with  . Drug Overdose    HPI Carolyn [name] is a 23 y.o. female.     Patient presents via EMS after accidental heroin overdose. Hx heroin abuse. Significant other at scene indicates possibly there was fentanyl in what patient had taken. First responder noted patient blue, not breathing, bystander giving cpr. Narcan given x 2 with return of consciousness and spontaneous breathing. Pt subsequently c/o nausea. No vomiting. Pt initially denies depression or intentionally trying to harm self. Denies other ingestion. States recent health at baseline. No cough or fever. Level 5 caveat - pt limited historian, accidental overdose.   The history is provided by the patient and the EMS personnel. The history is limited by the condition of the patient.  Drug Overdose  Pertinent negatives include no chest pain, no abdominal pain, no headaches and no shortness of breath.    Past Medical History:  Diagnosis Date  . Allergy   . Anxiety   . Asthma    childhood  . Depression   . Hepatitis C 12/2017  . Pneumonia   . Renal disorder     Patient Active Problem List   Diagnosis Date Noted  . 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 Normal labor and delivery 02/28/2016  . Irregular contractions 02/07/2016  . Hypertension in pregnancy, preeclampsia, severe, delivered 01/01/2015  . Postpartum care following vaginal delivery 12/31/2014  . Labor and delivery, indication for care 12/30/2014  . Abdominal pain 12/29/2014  . Labor and delivery indication for care or intervention 12/29/2014  . Braxton Hicks contractions 12/05/2014    No past surgical history on file.   OB History    Gravida  2   Para  2   Term  2   Preterm       AB      Living  2     SAB      TAB      Ectopic      Multiple  0   Live Births  2            Home Medications    Prior to Admission medications   Medication Sig Start Date End Date Taking? Authorizing Provider  acetaminophen (TYLENOL) 500 MG tablet Take 1,000 mg by mouth every 6 (six) hours as needed for mild pain.    [provider]  cephALEXin (KEFLEX) 500 MG capsule Take 1 capsule (500 mg total) by mouth 4 (four) times daily. For 7 days Patient not taking: Reported on 03/07/2018 02/24/18   Pauline Aus, PA-C  ciprofloxacin (CIPRO) 500 MG tablet Take 1 tablet (500 mg total) by mouth 2 (two) times daily. 03/07/18   Elson Areas, PA-C    Family History Family History  Problem Relation Age of Onset  . Hypertension Father   . Diabetes Father     Social History Social History   Tobacco Use  . Smoking status: Current Every Day Smoker    Packs/day: 1.00    Years: 7.00    Pack years: 7.00    Types: Cigarettes, E-cigarettes  . Smokeless tobacco: Never Used  Substance Use Topics  . Alcohol use: Not Currently    Alcohol/week: 4.0 standard drinks    Types: 4 Cans of beer per week  Comment: never heavy drinker  . Drug use: Yes    Types: Methamphetamines     Allergies   Advair diskus [fluticasone-salmeterol]; Sulfa antibiotics; Prednisone; and Banana   Review of Systems Review of Systems  Constitutional: Negative for fever.  HENT: Negative for sore throat.   Eyes: Negative for redness.  Respiratory: Negative for cough and shortness of breath.   Cardiovascular: Negative for chest pain.  Gastrointestinal: Positive for nausea. Negative for abdominal pain and vomiting.  Genitourinary: Negative for flank pain.  Musculoskeletal: Negative for back pain and neck pain.  Skin: Negative for rash.  Neurological: Negative for headaches.  Hematological: Does not bruise/bleed easily.  Psychiatric/Behavioral: Negative for suicidal ideas.      Physical Exam Updated Vital Signs Ht 1.499 m ( )   Wt 52.2 kg   LMP  (LMP Unknown)   SpO2 100%   BMI 23.23 kg/m   Physical Exam Vitals signs and nursing note reviewed.  Constitutional:      Appearance: Normal appearance. She is well-developed.  HENT:     Head: Atraumatic.     Nose: Nose normal.     Mouth/Throat:     Mouth: Mucous membranes are moist.  Eyes:     General: No scleral icterus.    Conjunctiva/sclera: Conjunctivae normal.     Pupils: Pupils are equal, round, and reactive to light.  Neck:     Musculoskeletal: Normal range of motion and neck supple. No neck rigidity or muscular tenderness.     Trachea: No tracheal deviation.  Cardiovascular:     Rate and Rhythm: Normal rate and regular rhythm.     Pulses: Normal pulses.     Heart sounds: Normal heart sounds. No murmur. No friction rub. No gallop.   Pulmonary:     Effort: Pulmonary effort is normal. No respiratory distress.     Breath sounds: Normal breath sounds.  Abdominal:     General: Abdomen is flat. Bowel sounds are normal. There is no distension.     Palpations: Abdomen is soft.     Tenderness: There is no abdominal tenderness. There is no guarding.  Genitourinary:    Comments: No cva tenderness.  Musculoskeletal:        General: No swelling.     Comments: Superficial cuts noted, ?cutting behavior. Not requiring sutures. No cellulitis.   Skin:    General: Skin is warm and dry.     Findings: No rash.  Neurological:     Mental Status: She is alert.     Comments: Awake and alert. Motor intact bil, stre 5/5. sens grossly intact.   Psychiatric:     Comments: Alert, content. Flat affect.       ED Treatments / Results  Labs (all labs ordered are listed, but only abnormal results are displayed) Results for orders placed or performed during the hospital encounter of 11/05/18  CBC  Result Value Ref Range   WBC 5.3 4.0 - 10.5 K/uL   RBC 4.46 3.87 - 5.11 MIL/uL   Hemoglobin 13.0 12.0 - 15.0  g/dL   HCT 16.1 09.6 - 04.5 %   MCV 89.2 80.0 - 100.0 fL   MCH 29.1 26.0 - 34.0 pg   MCHC 32.7 30.0 - 36.0 g/dL   RDW 40.9 81.1 - 91.4 %   Platelets 225 150 - 400 K/uL   nRBC 0.0 0.0 - 0.2 %  Comprehensive metabolic panel  Result Value Ref Range   Sodium 138 135 - 145 mmol/L   Potassium  3.7 3.5 - 5.1 mmol/L   Chloride 105 98 - 111 mmol/L   CO2 22 22 - 32 mmol/L   Glucose, Bld 107 (H) 70 - 99 mg/dL   BUN 7 6 - 20 mg/dL   Creatinine, Ser 4.27 0.44 - 1.00 mg/dL   Calcium 8.9 8.9 - 06.2 mg/dL   Total Protein 6.9 6.5 - 8.1 g/dL   Albumin 4.2 3.5 - 5.0 g/dL   AST 376 (H) 15 - 41 U/L   ALT 95 (H) 0 - 44 U/L   Alkaline Phosphatase 45 38 - 126 U/L   Total Bilirubin 0.8 0.3 - 1.2 mg/dL   GFR calc non Af Amer >60 >60 mL/min   GFR calc Af Amer >60 >60 mL/min   Anion gap 11 5 - 15  Ethanol  Result Value Ref Range   Alcohol, Ethyl (B) 15 (H) <10 mg/dL  Salicylate level  Result Value Ref Range   Salicylate Lvl <7.0 2.8 - 30.0 mg/dL  Acetaminophen level  Result Value Ref Range   Acetaminophen (Tylenol), Serum <10 (L) 10 - 30 ug/mL  I-Stat beta hCG blood, ED  Result Value Ref Range   I-stat hCG, quantitative <5.0 <5 mIU/mL   Comment 3           Dg Chest Port 1 View  Result Date: 11/05/2018 CLINICAL DATA:  Overdose, found unresponsive. EXAM: PORTABLE CHEST 1 VIEW COMPARISON:  09/05/2016 FINDINGS: Heart and mediastinal contours are within normal limits. No focal opacities or effusions. No acute bony abnormality. IMPRESSION: No active disease. Electronically Signed   By: Charlett Nose M.D.   On: 11/05/2018 21:13    EKG EKG Interpretation  Date/Time:  Friday November 05 2018 20:02:16 EDT Ventricular Rate:  100 PR Interval:    QRS Duration: 87 QT Interval:  354 QTC Calculation: 457 R Axis:   52 Text Interpretation:  Sinus tachycardia Nonspecific ST abnormality No significant change since last tracing Confirmed by Cathren Laine ([id number]) on 11/05/2018 8:35:19 PM   Radiology Dg Chest  Port 1 View  Result Date: 11/05/2018 CLINICAL DATA:  Overdose, found unresponsive. EXAM: PORTABLE CHEST 1 VIEW COMPARISON:  09/05/2016 FINDINGS: Heart and mediastinal contours are within normal limits. No focal opacities or effusions. No acute bony abnormality. IMPRESSION: No active disease. Electronically Signed   By: Charlett Nose M.D.   On: 11/05/2018 21:13    Procedures Procedures (including critical care time)  Medications Ordered in ED Medications - No data to display   Initial Impression / Assessment and Plan / ED Course  I have reviewed the triage vital signs and the nursing notes.  Pertinent labs & imaging results that were available during my care of the patient were reviewed by me and considered in my medical decision making (see chart for details).  Iv ns. Continuous pulse ox and monitor. Stat labs. Imaging.   Reviewed nursing notes and prior charts for additional history.   Labs reviewed by me - chem normal.  cxr reviewed by me - no pna.   Recheck pt - awake and alert, ambulating to bathroom. No respiratory depression.   On recheck, pt does acknowledge recent SI. Cuts to body noted. Flat affect. Will get [facility] evaluation ?depression, SI. Tetanus unknown. Tet im.   BH eval pending.   Recheck post 3.5 hrs in ED, no resp depression, breathing comfortably.   Disposition per [facility] team. Signed out to Dr Blinda Leatherwood to f/u with [facility] eval, and dispo appropriatley.       Final  Clinical Impressions(s) / ED Diagnoses   Final diagnoses:  None    ED Discharge Orders    None            Trennon Torbeck, KevinCathren Laine, MD 11/06/18 [phone]

## 2018-11-06 ENCOUNTER — Encounter (HOSPITAL_COMMUNITY): Payer: Self-pay

## 2018-11-06 ENCOUNTER — Other Ambulatory Visit: Payer: Self-pay

## 2018-11-06 ENCOUNTER — Inpatient Hospital Stay (HOSPITAL_COMMUNITY)
Admission: AD | Admit: 2018-11-06 | Discharge: 2018-11-08 | DRG: 885 | Disposition: A | Discharge status: Home / Self Care | Payer: No Typology Code available for payment source | Source: Intra-hospital | Attending: Psychiatry | Admitting: Psychiatry

## 2018-11-06 DIAGNOSIS — Z590 Homelessness: Secondary | ICD-10-CM | POA: Diagnosis not present

## 2018-11-06 DIAGNOSIS — Z833 Family history of diabetes mellitus: Secondary | ICD-10-CM

## 2018-11-06 DIAGNOSIS — Z8249 Family history of ischemic heart disease and other diseases of the circulatory system: Secondary | ICD-10-CM

## 2018-11-06 DIAGNOSIS — Z882 Allergy status to sulfonamides status: Secondary | ICD-10-CM

## 2018-11-06 DIAGNOSIS — F1110 Opioid abuse, uncomplicated: Secondary | ICD-10-CM

## 2018-11-06 DIAGNOSIS — F332 Major depressive disorder, recurrent severe without psychotic features: Secondary | ICD-10-CM | POA: Diagnosis present

## 2018-11-06 DIAGNOSIS — F1514 Other stimulant abuse with stimulant-induced mood disorder: Secondary | ICD-10-CM | POA: Diagnosis present

## 2018-11-06 DIAGNOSIS — T401X1A Poisoning by heroin, accidental (unintentional), initial encounter: Secondary | ICD-10-CM | POA: Diagnosis not present

## 2018-11-06 DIAGNOSIS — Z91018 Allergy to other foods: Secondary | ICD-10-CM

## 2018-11-06 DIAGNOSIS — Z888 Allergy status to other drugs, medicaments and biological substances status: Secondary | ICD-10-CM

## 2018-11-06 DIAGNOSIS — F1510 Other stimulant abuse, uncomplicated: Secondary | ICD-10-CM | POA: Diagnosis present

## 2018-11-06 LAB — RAPID URINE DRUG SCREEN, HOSP PERFORMED
Amphetamines: POSITIVE — AB
Barbiturates: NOT DETECTED
Benzodiazepines: NOT DETECTED
Cocaine: NOT DETECTED
Opiates: NOT DETECTED
Tetrahydrocannabinol: NOT DETECTED

## 2018-11-06 MED ORDER — CLONIDINE HCL 0.1 MG PO TABS
0.1000 mg | ORAL_TABLET | ORAL | Status: DC
  Filled 2018-11-06 (×4): qty 1

## 2018-11-06 MED ORDER — MAGNESIUM HYDROXIDE 400 MG/5ML PO SUSP: 30.0000 mL | Freq: Every day | ORAL | Status: DC | PRN

## 2018-11-06 MED ORDER — ALUM & MAG HYDROXIDE-SIMETH 200-200-20 MG/5ML PO SUSP: 30.0000 mL | ORAL | Status: DC | PRN

## 2018-11-06 MED ORDER — DICYCLOMINE HCL 20 MG PO TABS: 20.0000 mg | ORAL_TABLET | Freq: Four times a day (QID) | ORAL | Status: DC | PRN

## 2018-11-06 MED ORDER — GABAPENTIN 300 MG PO CAPS
300.0000 mg | ORAL_CAPSULE | Freq: Three times a day (TID) | ORAL | Status: DC
  Administered 2018-11-06 – 2018-11-08 (×6): 300 mg via ORAL
  Filled 2018-11-06 (×4): qty 1
  Filled 2018-11-06: qty 21
  Filled 2018-11-06: qty 1
  Filled 2018-11-06: qty 21
  Filled 2018-11-06 (×4): qty 1
  Filled 2018-11-06: qty 21
  Filled 2018-11-06: qty 1

## 2018-11-06 MED ORDER — GABAPENTIN 300 MG PO CAPS
300.0000 mg | ORAL_CAPSULE | Freq: Three times a day (TID) | ORAL | Status: DC
  Administered 2018-11-06: 10:00:00 300 mg via ORAL
  Filled 2018-11-06: qty 1

## 2018-11-06 MED ORDER — ACETAMINOPHEN 325 MG PO TABS: 650.0000 mg | ORAL_TABLET | Freq: Four times a day (QID) | ORAL | Status: DC | PRN

## 2018-11-06 MED ORDER — NAPROXEN 500 MG PO TABS: 500.0000 mg | ORAL_TABLET | Freq: Two times a day (BID) | ORAL | Status: DC | PRN

## 2018-11-06 MED ORDER — HYDROXYZINE HCL 25 MG PO TABS
25.0000 mg | ORAL_TABLET | Freq: Four times a day (QID) | ORAL | Status: DC | PRN
  Administered 2018-11-07: 21:00:00 25 mg via ORAL
  Filled 2018-11-06: qty 10
  Filled 2018-11-06: qty 1

## 2018-11-06 MED ORDER — ONDANSETRON 4 MG PO TBDP: 4.0000 mg | ORAL_TABLET | Freq: Four times a day (QID) | ORAL | Status: DC | PRN

## 2018-11-06 MED ORDER — LORAZEPAM 1 MG PO TABS
1.0000 mg | ORAL_TABLET | Freq: Four times a day (QID) | ORAL | Status: DC | PRN
  Filled 2018-11-06: qty 1

## 2018-11-06 MED ORDER — METHOCARBAMOL 500 MG PO TABS: 500.0000 mg | ORAL_TABLET | Freq: Three times a day (TID) | ORAL | Status: DC | PRN

## 2018-11-06 MED ORDER — CLONIDINE HCL 0.1 MG PO TABS
0.1000 mg | ORAL_TABLET | Freq: Four times a day (QID) | ORAL | Status: DC
  Administered 2018-11-06: 15:00:00 0.1 mg via ORAL
  Filled 2018-11-06 (×11): qty 1

## 2018-11-06 MED ORDER — LOPERAMIDE HCL 2 MG PO CAPS: 2.0000 mg | ORAL_CAPSULE | ORAL | Status: DC | PRN

## 2018-11-06 MED ORDER — PNEUMOCOCCAL VAC POLYVALENT 25 MCG/0.5ML IJ INJ: 0.5000 mL | INJECTION | INTRAMUSCULAR | Status: DC

## 2018-11-06 MED ORDER — CLONIDINE HCL 0.1 MG PO TABS: 0.1000 mg | ORAL_TABLET | Freq: Every day | ORAL | Status: DC

## 2018-11-06 NOTE — ED Notes (Signed)
ALL belongings - 1 labeled belongings bag - [place].

## 2018-11-06 NOTE — Progress Notes (Signed)
Patient did not attend wrap-up group because she was asleep.  

## 2018-11-06 NOTE — ED Notes (Signed)
[facility] requests patient be admitted and suggests voluntary.  Pat able to agree and sign consent.

## 2018-11-06 NOTE — Progress Notes (Signed)
Received Carolyn [name] from the main ED, alert and escorted to her room with the sitter at the bedside. She was offered to talk with her friend Florentina Addison, but refused. She was evasive, verbalized she is ok and trying to sleep. Later she agreed to talk with TTS via video. She received her Tdap this AM in her left deltoid without incident. She slept throughout the night without incident.

## 2018-11-06 NOTE — Progress Notes (Signed)
Nira Conn, NP recommends continued observation for safety and stabilization and to be reassessed in the AM by psych due to the pt's hx of OD and statements reporting she has been experiencing SI. Pt's nurse Randa Evens, RN has been advised. TTS attempted to inform the EDP but was told he is currently unavailable.   Princess Bruins, MSW, LCSW Therapeutic Triage Specialist  [phone]
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
## 2018-11-06 NOTE — ED Notes (Signed)
Pt calling friend to give update on her admission status.  Tearful without other outward sx.

## 2018-11-06 NOTE — Progress Notes (Signed)
TTS spoke with EDP Dr. Blinda Leatherwood, MD and advised of the disposition.  Princess Bruins, MSW, LCSW Therapeutic Triage Specialist  [phone]

## 2018-11-06 NOTE — Tx Team (Signed)
Initial Treatment Plan 11/06/2018 12:20 PM Carolyn [name] [id number]    PATIENT STRESSORS: Financial difficulties Health problems Substance abuse   PATIENT STRENGTHS: Ability for insight Average or above average intelligence Capable of independent living Motivation for treatment/growth Physical Health Supportive family/friends   PATIENT IDENTIFIED PROBLEMS: "be happy with myself"  Depression/anxiety  Substance abuse                 DISCHARGE CRITERIA:  Ability to meet basic life and health needs Adequate post-discharge living arrangements Improved stabilization in mood, thinking, and/or behavior Motivation to continue treatment in a less acute level of care  PRELIMINARY DISCHARGE PLAN: Attend aftercare/continuing care group Attend PHP/IOP Attend 12-step recovery group Outpatient therapy  PATIENT/FAMILY INVOLVEMENT: This treatment plan has been presented to and reviewed with the patient, Carolyn [name].  The patient and family have been given the opportunity to ask questions and make suggestions.  Raylene Miyamoto, RN 11/06/2018, 12:20 PM

## 2018-11-06 NOTE — [facility] Suicide Risk Assessment (Signed)
[facility]   Nursing information obtained from:  Patient Demographic factors:  Adolescent or young adult, Unemployed, Low socioeconomic status, Caucasian Current Mental Status:  Self-harm behaviors Loss Factors:  Decrease in vocational status, Financial problems / change in socioeconomic status Historical Factors:  Impulsivity Risk Reduction Factors:  Positive social support, Positive therapeutic relationship  Total Time spent with patient: 30 minutes Principal Problem: <principal problem not specified> Diagnosis:  Active Problems:   Major depressive disorder, recurrent severe without psychotic features (HCC)  Subjective Data: Patient is seen and examined.  Patient is a 23 year old female with a past psychiatric history significant for opioid dependence and methamphetamine dependence who [facility] emergency department on 11/06/2018 after an unintentional overdose of heroin.  The patient was found in a gas station parking lot.  She was unconscious.  She was given 5 mg of Narcan.  The patient denies that this was a suicide attempt.  She stated that she has overdosed several times in the past, and each time it is been unintentional.  She denied any suicidal ideation, but had spoken to the emergency department staff about being suicidal over the last week or so.  The patient stated 1 of her biggest stressors is the fact that she has not worked in several months to over a year.  She lives with friends, and was recently discharged from jail.  She would not discuss the circumstances of her arrest that led to jail.  She denied any desire for substance rehabilitation, or treatment for her drug problem.  She stated that in the last 90 days that she has gone at least 30 days without using any substances.  She was admitted to the hospital for evaluation and stabilization.  Continued Clinical Symptoms:  Alcohol Use Disorder Identification Test Final  Score (AUDIT): 3 The "Alcohol Use Disorders Identification Test", Guidelines for Use in Primary Care, Second Edition.  World Science writerHealth Organization [facility](WHO). Score between 0-7:  no or low risk or alcohol related problems. Score between 8-15:  moderate risk of alcohol related problems. Score between 16-19:  high risk of alcohol related problems. Score 20 or above:  warrants further diagnostic evaluation for alcohol dependence and treatment.   CLINICAL FACTORS:   Alcohol/Substance Abuse/Dependencies   Musculoskeletal: Strength & Muscle Tone: within normal limits Gait & Station: normal Patient leans: N/A  Psychiatric Specialty Exam: Physical Exam  Nursing note and vitals reviewed. Constitutional: She is oriented to person, place, and time. She appears well-developed and well-nourished.  HENT:  Head: Normocephalic and atraumatic.  Respiratory: Effort normal.  Neurological: She is alert and oriented to person, place, and time.    ROS  Blood pressure 105/70, pulse 98, temperature 98.1 F (36.7 C), temperature source Oral, resp. rate 18, height 5\' 1"  (1.549 m), weight 47.6 kg, SpO2 98 %, unknown if currently breastfeeding.Body mass index is 19.84 kg/m.  General Appearance: Disheveled  Eye Contact:  Fair  Speech:  Normal Rate  Volume:  Normal  Mood:  Anxious  Affect:  Congruent  Thought Process:  Coherent and Descriptions of Associations: Circumstantial  Orientation:  Full (Time, Place, and Person)  Thought Content:  Logical  Suicidal Thoughts:  No  Homicidal Thoughts:  No  Memory:  Immediate;   Fair Recent;   Fair Remote;   Fair  Judgement:  Intact  Insight:  Lacking  Psychomotor Activity:  Normal  Concentration:  Concentration: Fair and Attention Span: Fair  Recall:  [employer]  [employer] of  Knowledge:  Fair  Language:  Fair  Akathisia:  Negative  Handed:  Right  AIMS (if indicated):     Assets:  Desire for Improvement Resilience  ADL's:  Intact  Cognition:  WNL  Sleep:          COGNITIVE FEATURES THAT CONTRIBUTE TO RISK:  None    SUICIDE RISK:   Minimal: No identifiable suicidal ideation.  Patients presenting with no risk factors but with morbid ruminations; may be classified as minimal risk based on the severity of the depressive symptoms  PLAN OF CARE: Patient is seen and examined.  Patient is a 23 year old female with a past psychiatric history significant for opioid dependence, unintentional opioid overdose, and methamphetamine use disorder.  She will be admitted to the hospital.  She will be integrated into the milieu.  She will be encouraged to attend groups.  She denies any current suicidal ideation.  We will admit her to the hospital and monitor her for suicidal ideation over the next couple days.  She will be placed on a clonidine opioid withdrawal protocol.  We will attempt to collect collateral information, that but this may be difficult.  Review of her laboratory revealed positive drug screen for amphetamines, but negative for opiates.  She has significant elevated liver function enzymes.  She denied any alcohol use.  Her AST is 124, and her ALT is 95.  We will repeat these.  We will also check a hepatitis panel.  I asked the patient about any sexually transmitted diseases, and she stated that she had been tested after she got out of jail within the last 6 months, and she was HIV negative.  Also of note her blood alcohol was 15 in the emergency room, and despite her denial of alcohol use excessively we will place Ativan 1 mg p.o. every 6 hours PRN CIWA greater than 10.  I certify that inpatient services furnished can reasonably be expected to improve the patient's condition.   Antonieta Pert, MD 11/06/2018, 1:28 PM

## 2018-11-06 NOTE — Progress Notes (Signed)
D.  Pt in bed on approach, no complaints voiced.  Pt's BP was low so Clonidine held and Pt given pitcher of Gatorade.  Pt has remained in bed this evening, she is a new admission to the unit.  Pt denies SI/HI/AVH at this time.  A.  Support and encouragement offered, medication given as ordered  R.  Pt remains safe on the unit,will continue to monitor.

## 2018-11-06 NOTE — Progress Notes (Signed)
Adult Psychoeducational Group Note  Date:  11/06/2018 Time:  1:45pm-3:00pm  Group Topic/Focus:  Managing Feelings:   The focus of this group is to identify what feelings patients have difficulty handling and develop a plan to handle them in a healthier way upon discharge.  Participation Level:  Active  Participation Quality:  Appropriate and Attentive  Affect:  Appropriate  Cognitive:  Alert, Appropriate and Oriented  Insight: Appropriate and Good  Engagement in Group:  Engaged  Modes of Intervention:  Discussion and Support  Additional Comments:  Patient informed the group that she has been struggling with substance abuse. Patient informed the group that she can cope with this by being motivated to do it for herself instead of others. Patient informed the group that this is a better coping skills because she feels like you have to do recovery on your own and make different choices. Patient informed the group that friends could assist her in coping by not offering or pressuring her to use. Patient informed the group that negative coping skills that she used in the past include going missing, lying, going around people that will use.   Annye Asa 11/06/2018, 7:23 PM

## 2018-11-06 NOTE — [facility] Group Notes (Signed)
[facility] Group Notes:  (Nursing/MHT/Case Management/Adjunct)  Date:  11/06/2018  Time:  4:00 PM  Type of Therapy:  Nurse Education  Participation Level:  Did Not Attend   Raylene Miyamoto 11/06/2018, 6:09 PM

## 2018-11-06 NOTE — Progress Notes (Signed)
Per Berneice Heinrich, [facility], pt has been accepted to [facility] bed 305/01. Accepting provider is Nanine Means NP, Attending provider is Dr. Jola Babinski MD. Patient can arrive as soon as possible. Number for report is [phone]. CSW spoke with Marcelino Duster RN to notify of above.   Trula Slade, MSW, LCSW Clinical Social Worker 11/06/2018 9:40 AM

## 2018-11-06 NOTE — ED Notes (Signed)
Breakfast ordered regular  

## 2018-11-06 NOTE — BH Assessment (Addendum)
Tele Assessment Note   Patient Name: Janila Arrazola MRN: [id number] Referring Physician:  Location of Patient: [facility] Location of Provider: Behavioral Health TTS Department  Gwenna Fuston is an 23 y.o. female who presents to the ED voluntarily following an unintentional OD. Pt was reportedly found unconscious in a gas station parking lot and was administered 5 mg of narcan. Pt states she has overdosed "a couple of times" in the past but denies any of them to be intentional. 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 Pt states she has been experiencing difficult emotions due to not working, facing legal issues, and erratic sleeping and eating habits. Pt states she "either eats too much or not at all" and "sleeps all day long or will stay awake for days at a time."  Pt states she has been admitted to substance abuse treatment facilities in the past and she is interested in ongoing substance abuse counseling. Pt states she has support from family and friends that encourage her to stop abusing substances. TTS asked for consent to contact any of her supports in order to obtain collateral information and she declined.  Nira Conn, NP recommends continued observation for safety and stabilization and to be reassessed in the AM by psych due to the pt's hx of OD and statements reporting she has been experiencing SI. Pt's nurse Randa Evens, RN has been advised.  Diagnosis: Substance induced mood disorder; Opioid use disorder, severe; Stimulant use disorder, severe   Past Medical History:  Past Medical History:  Diagnosis Date  . Allergy   . Anxiety   . Asthma    childhood  . Depression   . Drug overdose 11/05/2018  . Hepatitis C 12/2017  . Pneumonia   . Renal disorder     History reviewed. No pertinent surgical history.  Family History:  Family History   Problem Relation Age of Onset  . Hypertension Father   . Diabetes Father     Social History:  reports that she has been smoking cigarettes and e-cigarettes. She has a 7.00 pack-year smoking history. She has never used smokeless tobacco. She reports previous alcohol use of about 4.0 standard drinks of alcohol per week. She reports current drug use. Drug: Methamphetamines.  Additional Social History:  Alcohol / Drug Use Pain Medications: SEE MAR Prescriptions: SEE MAR Over the Counter: SEE MAR History of alcohol / drug use?: Yes Longest period of sobriety (when/how long): 6 months Negative Consequences of Use: Personal relationships, Work / Programmer, multimedia, Armed forces operational officer, Surveyor, quantity Withdrawal Symptoms: Patient aware of relationship between substance abuse and physical/medical complications, Irritability Substance #1 Name of Substance 1: Heroin 1 - Age of First Use: 18 1 - Amount (size/oz): pt states "not much" however she OD PTA and was found unconscious 1 - Frequency: weekly 1 - Duration: ongoing 1 - Last Use / Amount: 11/05/18 Substance #2 Name of Substance 2: Meth 2 - Age of First Use: 18 2 - Amount (size/oz): $20 worth 2 - Frequency: weekly 2 - Duration: ongoing 2 - Last Use / Amount: 11/04/18  CIWA: CIWA-Ar BP: 97/72 Pulse Rate: 85 COWS:    Allergies:  Allergies  Allergen Reactions  . Advair Diskus [Fluticasone-Salmeterol] Other (See Comments)    Pt states she "blacked out"  . Sulfa Antibiotics Other (See Comments)    Infant (unk)  . Prednisone  Made her bonkers blackouts  . Banana Itching    Home Medications: (Not in a hospital admission)   OB/GYN Status:  No LMP recorded (lmp unknown). (Menstrual status: Irregular Periods).  General Assessment Data [facility] TTS Assessment: In system Is this a Tele or Face-to-Face Assessment?: Tele Assessment Is this an Initial Assessment or a Re-assessment for this encounter?: Initial Assessment Patient  Accompanied by:: N/A Language Other than English: No Living Arrangements: Other (Comment) What gender do you identify as?: Female Marital status: Long term relationship Pregnancy Status: No Living Arrangements: Non-relatives/Friends Can pt return to current living arrangement?: Yes Admission Status: Voluntary Is patient capable of signing voluntary admission?: Yes Referral Source: Self/Family/Friend Insurance type: none on file     Crisis Care Plan Living Arrangements: Non-relatives/Friends Name of Psychiatrist: none Name of Therapist: none  Education Status Is patient currently in school?: No Is the patient employed, unemployed or receiving disability?: Unemployed  Risk to self with the past 6 months Suicidal Ideation: No-Not Currently/Within Last 6 Months Has patient been a risk to self within the past 6 months prior to admission? : Yes Suicidal Intent: No Has patient had any suicidal intent within the past 6 months prior to admission? : No Is patient at risk for suicide?: Yes Suicidal Plan?: No Has patient had any suicidal plan within the past 6 months prior to admission? : No Access to Means: No What has been your use of drugs/alcohol within the last 12 months?: heroin, meth Previous Attempts/Gestures: No Other Self Harm Risks: self-harm, substance abuse Triggers for Past Attempts: None known Intentional Self Injurious Behavior: Cutting Comment - Self Injurious Behavior: pt has hx of cutting Family Suicide History: No Recent stressful life event(s): Job Loss, Legal Issues, Financial Problems Persecutory voices/beliefs?: No Depression: Yes Depression Symptoms: Feeling worthless/self pity, Fatigue, Feeling angry/irritable Substance abuse history and/or treatment for substance abuse?: Yes Suicide prevention information given to non-admitted patients: Not applicable  Risk to Others within the past 6 months Homicidal Ideation: No Does patient have any lifetime risk of  violence toward others beyond the six months prior to admission? : No Thoughts of Harm to Others: No Current Homicidal Intent: No Current Homicidal Plan: No Access to Homicidal Means: No History of harm to others?: No Assessment of Violence: None Noted Does patient have access to weapons?: No Criminal Charges Pending?: Yes Describe Pending Criminal Charges: possession Does patient have a court date: Yes Court Date: (May 2020) Is patient on probation?: No  Psychosis Hallucinations: None noted Delusions: None noted  Mental Status Report Appearance/Hygiene: Unremarkable, In scrubs Eye Contact: Good Motor Activity: Freedom of movement Speech: Logical/coherent Level of Consciousness: Alert Mood: Euthymic Affect: Appropriate to circumstance Anxiety Level: None Thought Processes: Relevant, Coherent Judgement: Impaired Orientation: Person, Place, Time, Appropriate for developmental age Obsessive Compulsive Thoughts/Behaviors: None  Cognitive Functioning Concentration: Normal Memory: Remote Intact, Recent Intact Is patient IDD: No Insight: Poor Impulse Control: Poor Appetite: Fair Have you had any weight changes? : No Change Sleep: Decreased Total Hours of Sleep: 4(varies) Vegetative Symptoms: None  ADLScreening [facility] Assessment Services) Patient's cognitive ability adequate to safely complete daily activities?: Yes Patient able to express need for assistance with ADLs?: Yes Independently performs ADLs?: Yes (appropriate for developmental age)  Prior Inpatient Therapy Prior Inpatient Therapy: Yes Prior Therapy Dates: 2018 [facility] Reason for Treatment: substance abuse  Prior Outpatient Therapy Prior Outpatient Therapy: No Does patient have an ACCT team?: No Does patient have  Intensive In-House Services?  : No Does patient have [facility] services? : No Does patient have P4CC services?: No  ADL Screening (condition  at time of admission) Patient's cognitive ability adequate to safely complete daily activities?: Yes Is the patient deaf or have difficulty hearing?: No Does the patient have difficulty seeing, even when wearing glasses/contacts?: No Does the patient have difficulty concentrating, remembering, or making decisions?: No Patient able to express need for assistance with ADLs?: Yes Does the patient have difficulty dressing or bathing?: No Independently performs ADLs?: Yes (appropriate for developmental age) Does the patient have difficulty walking or climbing stairs?: No Weakness of Legs: None Weakness of Arms/Hands: None  Home Assistive Devices/Equipment Home Assistive Devices/Equipment: None    Abuse/Neglect Assessment (Assessment to be complete while patient is alone) Abuse/Neglect Assessment Can Be Completed: Yes Physical Abuse: Denies Verbal Abuse: Denies Sexual Abuse: Denies Exploitation of patient/patient's resources: Denies Self-Neglect: Denies     Merchant navy officer (For Healthcare) Does Patient Have a Medical Advance Directive?: No Would patient like information on creating a medical advance directive?: No - Patient declined          Disposition: Nira Conn, NP recommends continued observation for safety and stabilization and to be reassessed in the AM by psych due to the pt's hx of OD and statements reporting she has been experiencing SI. Pt's nurse Randa Evens, RN has been advised. Disposition Initial Assessment Completed for this Encounter: Yes Disposition of Patient: (overnight OBS pending AM psych assessment) Patient refused recommended treatment: No  This service was provided via telemedicine using a 2-way, interactive audio and video technology.  Names of all persons participating in this telemedicine service and their role in this encounter. Name: Mehjabeen Torris Role: Patient  Name: Princess Bruins Role: TTS          Karolee Ohs 11/06/2018 1:20 AM

## 2018-11-06 NOTE — Progress Notes (Signed)
Based on the patient's history of overdose and self-harm behaviors, including this overdose, patient needs to be hospitalized.  High risk, no previous admission but continues to have risky, self-harm behaviors.  Admit to [facility] 305-1.  If she does not come voluntarily, she will need to be IVCd.  Nanine Means, PMHNP

## 2018-11-06 NOTE — Progress Notes (Signed)
Admission note  Pt is a 23 yo female that presents voluntarily on 11/06/2018 after being found behind a dumpster stating this was an accidental overdose. Pt states she was been using meth and heroin. Pt denies any physical pain, but does seem anxious. Pt was administered narcan when found. Pt states she was never suicidal. Pt does admit previous si when she was teenager with no plan. Pt denies hi/ah/vh and verbally agrees to approach staff if these become apparent or before harming herself/others while at [facility]. Pt denies Rx/alcohol abuse. Pt states is a former user of e cigs. Pt denies a patch/gum. Pt denies previous/current verbal/physical/sexual abuse. Pt denies having a pcp/dentist. Pt is sexually active but denies contraception. Pt has self inflicted scarring to her L wrist which is healing. Pt states her [place] Pt states she attended college at [employer] for 1 year but dropped out. Pt would like the pneumonia vaccine. Pt denies being out of the state/country in the last month. Pt denies having a job at this time.   Consents signed, skin/belongings search completed and patient oriented to unit. Patient stable at this time. Patient given the opportunity to express concerns and ask questions. Patient given toiletries. Will continue to monitor.

## 2018-11-07 LAB — HEPATIC FUNCTION PANEL
ALT: 50 U/L — ABNORMAL HIGH (ref 0–44)
AST: 35 U/L (ref 15–41)
Albumin: 3.6 g/dL (ref 3.5–5.0)
Alkaline Phosphatase: 40 U/L (ref 38–126)
Bilirubin, Direct: 0.1 mg/dL (ref 0.0–0.2)
Indirect Bilirubin: 0.5 mg/dL (ref 0.3–0.9)
Total Bilirubin: 0.6 mg/dL (ref 0.3–1.2)
Total Protein: 6.2 g/dL — ABNORMAL LOW (ref 6.5–8.1)

## 2018-11-07 NOTE — Progress Notes (Signed)
[facility] Group Notes:  (Nursing/MHT/Case Management/Adjunct)  Date:  11/07/2018  Time:  2030 Type of Therapy:  wrap up group  Participation Level:  Active  Participation Quality:  Appropriate, Attentive, Sharing and Supportive  Affect:  Appropriate  Cognitive:  Appropriate  Insight:  Improving  Engagement in Group:  Engaged  Modes of Intervention:  Clarification, Education and Support  Summary of Progress/Problems: Pt is looking forward to a discharge tomorrow and reports feeling ready to go. Pt shared that she would like to change the way she thinks. Pt wants to be more positive. Pt is grateful for her 24 year old son.   Johann Capers S 11/07/2018, 10:05 PM

## 2018-11-07 NOTE — Progress Notes (Signed)
[place] NOVEL CORONAVIRUS (COVID-19) DAILY CHECK-OFF SYMPTOMS - answer yes or no to each - every day NO YES  Have you had a fever in the past 24 hours?  . Fever (Temp > 37.80C / 100F) X   Have you had any of these symptoms in the past 24 hours? . New Cough .  Sore Throat  .  Shortness of Breath .  Difficulty Breathing .  Unexplained Body Aches   X   Have you had any one of these symptoms in the past 24 hours not related to allergies?   . Runny Nose .  Nasal Congestion .  Sneezing   X   If you have had runny nose, nasal congestion, sneezing in the past 24 hours, has it worsened?  X   EXPOSURES - check yes or no X   Have you traveled outside the state in the past 14 days?  X   Have you been in contact with someone with a confirmed diagnosis of COVID-19 or PUI in the past 14 days without wearing appropriate PPE?  X   Have you been living in the same home as a person with confirmed diagnosis of COVID-19 or a PUI (household contact)?    X   Have you been diagnosed with COVID-19?    X              What to do next: Answered NO to all: Answered YES to anything:   Proceed with unit schedule Follow the [facility] Inpatient Flowsheet.   

## 2018-11-07 NOTE — Progress Notes (Signed)
[facility] MD Progress Note  11/07/2018 10:45 AM Carolyn [name]  MRN:  [id number] Subjective:  Carolyn [name] reports " I have been a good day just ready to go"  Evaluation: Patient observed attending daily group sessions with active and engaged participation.  She is awake alert and oriented x3.  Currently denying suicidal or homicidal ideations.  Denies auditory or visual hallucinations.  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  I.e. headaches, nausea and vomiting has resolved.  She denies depression or depressive symptoms.  Reports a good appetite.  States she is resting well throughout the night.  Support encouragement and reassurance was provided.   Principal Problem: <principal problem not specified> Diagnosis: Active Problems:   Major depressive disorder, recurrent severe without psychotic features (HCC)  Total Time spent with patient: 15 minutes  Past Psychiatric History: Major depression and substance abuse  Past Medical History:  Past Medical History:  Diagnosis Date  . Allergy   . Anxiety   . Asthma    childhood  . Depression   . Drug overdose 11/05/2018  . Hepatitis C 12/2017  . Pneumonia   . Renal disorder    History reviewed. No pertinent surgical history. Family History:  Family History  Problem Relation Age of Onset  . Hypertension Father   . Diabetes Father    Family Psychiatric  History:  Social History:  Social History   Substance and Sexual Activity  Alcohol Use Not Currently  . Alcohol/week: 4.0 standard drinks  . Types: 4 Cans of beer per week   Comment: never heavy drinker     Social History   Substance and Sexual Activity  Drug Use Yes  . Types: Methamphetamines, Heroin    Social History   Socioeconomic History  . Marital status: Single    Spouse name: Not on file  . Number of children: Not on file  . Years of education: Not on file  . Highest education level: Not on file  Occupational History  . Not on file   Social Needs  . Financial resource strain: Not on file  . Food insecurity:    Worry: Not on file    Inability: Not on file  . Transportation needs:    Medical: Not on file    Non-medical: Not on file  Tobacco Use  . Smoking status: Former Smoker    Packs/day: 1.00    Years: 7.00    Pack years: 7.00    Types: Cigarettes, E-cigarettes  . Smokeless tobacco: Never Used  Substance and Sexual Activity  . Alcohol use: Not Currently    Alcohol/week: 4.0 standard drinks    Types: 4 Cans of beer per week    Comment: never heavy drinker  . Drug use: Yes    Types: Methamphetamines, Heroin  . Sexual activity: Yes    Birth control/protection: None  Lifestyle  . Physical activity:    Days per week: Not on file    Minutes per session: Not on file  . Stress: Not on file  Relationships  . Social connections:    Talks on phone: Not on file    Gets together: Not on file    Attends religious service: Not on file    Active member of club or organization: Not on file    Attends meetings of clubs or organizations: Not on file    Relationship status: Not on file  Other Topics Concern  . Not on file  Social History Narrative  . Not on  file   Additional Social History:                         Sleep: Fair  Appetite:  Fair  Current Medications: Current Facility-Administered Medications  Medication Dose Route Frequency Provider Last Rate Last Dose  . acetaminophen (TYLENOL) tablet 650 mg  650 mg Oral Q6H PRN Antonieta Pert, MD      . alum & mag hydroxide-simeth (MAALOX/MYLANTA) 200-200-20 MG/5ML suspension 30 mL  30 mL Oral Q4H PRN Antonieta Pert, MD      . cloNIDine (CATAPRES) tablet 0.1 mg  0.1 mg Oral QID Oneta Rack, NP   0.1 mg at 11/06/18 1509   Followed by  . [START ON 11/08/2018] cloNIDine (CATAPRES) tablet 0.1 mg  0.1 mg Oral Esperanza Heir, NP       Followed by  . [START ON 11/11/2018] cloNIDine (CATAPRES) tablet 0.1 mg  0.1 mg Oral QAC breakfast  Oneta Rack, NP      . dicyclomine (BENTYL) tablet 20 mg  20 mg Oral Q6H PRN Oneta Rack, NP      . gabapentin (NEURONTIN) capsule 300 mg  300 mg Oral TID Antonieta Pert, MD   300 mg at 11/07/18 0909  . hydrOXYzine (ATARAX/VISTARIL) tablet 25 mg  25 mg Oral Q6H PRN Oneta Rack, NP      . loperamide (IMODIUM) capsule 2-4 mg  2-4 mg Oral PRN Oneta Rack, NP      . LORazepam (ATIVAN) tablet 1 mg  1 mg Oral Q6H PRN Antonieta Pert, MD      . magnesium hydroxide (MILK OF MAGNESIA) suspension 30 mL  30 mL Oral Daily PRN Antonieta Pert, MD      . methocarbamol (ROBAXIN) tablet 500 mg  500 mg Oral Q8H PRN Oneta Rack, NP      . naproxen (NAPROSYN) tablet 500 mg  500 mg Oral BID PRN Oneta Rack, NP      . ondansetron (ZOFRAN-ODT) disintegrating tablet 4 mg  4 mg Oral Q6H PRN Oneta Rack, NP      . pneumococcal 23 valent vaccine (PNU-IMMUNE) injection 0.5 mL  0.5 mL Intramuscular Tomorrow-1000 Antonieta Pert, MD        Lab Results:  Results for orders placed or performed during the hospital encounter of 11/05/18 (from the past 48 hour(s))  CBC     Status: None   Collection Time: 11/05/18  8:06 PM  Result Value Ref Range   WBC 5.3 4.0 - 10.5 K/uL   RBC 4.46 3.87 - 5.11 MIL/uL   Hemoglobin 13.0 12.0 - 15.0 g/dL   HCT 24.4 01.0 - 27.2 %   MCV 89.2 80.0 - 100.0 fL   MCH 29.1 26.0 - 34.0 pg   MCHC 32.7 30.0 - 36.0 g/dL   RDW 53.6 64.4 - 03.4 %   Platelets 225 150 - 400 K/uL   nRBC 0.0 0.0 - 0.2 %    Comment: Performed at [facility], [address], [place], [place] [address]  Comprehensive metabolic panel     Status: Abnormal   Collection Time: 11/05/18  8:06 PM  Result Value Ref Range   Sodium 138 135 - 145 mmol/L   Potassium 3.7 3.5 - 5.1 mmol/L   Chloride 105 98 - 111 mmol/L   CO2 22 22 - 32 mmol/L   Glucose, Bld 107 (H) 70 - 99  mg/dL   BUN 7 6 - 20 mg/dL   Creatinine, Ser 0.98 0.44 - 1.00 mg/dL   Calcium 8.9 8.9 - 11.9 mg/dL   Total  Protein 6.9 6.5 - 8.1 g/dL   Albumin 4.2 3.5 - 5.0 g/dL   AST 147 (H) 15 - 41 U/L   ALT 95 (H) 0 - 44 U/L   Alkaline Phosphatase 45 38 - 126 U/L   Total Bilirubin 0.8 0.3 - 1.2 mg/dL   GFR calc non Af Amer >60 >60 mL/min   GFR calc Af Amer >60 >60 mL/min   Anion gap 11 5 - 15    Comment: Performed at [facility], [address], [place], [place] [address]  Ethanol     Status: Abnormal   Collection Time: 11/05/18  8:06 PM  Result Value Ref Range   Alcohol, Ethyl (B) 15 (H) <10 mg/dL    Comment: (NOTE) Lowest detectable limit for serum alcohol is 10 mg/dL. For medical purposes only. Performed at [facility], [address] [place], [place] [address]   Salicylate level     Status: None   Collection Time: 11/05/18  8:06 PM  Result Value Ref Range   Salicylate Lvl <7.0 2.8 - 30.0 mg/dL    Comment: Performed at [facility], [address], [place], [place] [address]  Acetaminophen level     Status: Abnormal   Collection Time: 11/05/18  8:06 PM  Result Value Ref Range   Acetaminophen (Tylenol), Serum <10 (L) 10 - 30 ug/mL    Comment: (NOTE) Therapeutic concentrations vary significantly. A range of 10-30 ug/mL  may be an effective concentration for many patients. However, some  are best treated at concentrations outside of this range. Acetaminophen concentrations >150 ug/mL at 4 hours after ingestion  and >50 ug/mL at 12 hours after ingestion are often associated with  toxic reactions. Performed at [facility], [address], [place], [place] [address]   I-Stat beta hCG blood, ED     Status: None   Collection Time: 11/05/18  8:11 PM  Result Value Ref Range   I-stat hCG, quantitative <5.0 <5 mIU/mL   Comment 3            Comment:   GEST. AGE      CONC.  (mIU/mL)   <=1 WEEK        5 - 50     2 WEEKS       50 - 500     3 WEEKS       100 - 10,000     4 WEEKS     1,000 - 30,000        FEMALE AND NON-PREGNANT FEMALE:     LESS THAN 5 mIU/mL   Rapid  urine drug screen (hospital performed)     Status: Abnormal   Collection Time: 11/06/18  8:15 AM  Result Value Ref Range   Opiates NONE DETECTED NONE DETECTED   Cocaine NONE DETECTED NONE DETECTED   Benzodiazepines NONE DETECTED NONE DETECTED   Amphetamines POSITIVE (A) NONE DETECTED   Tetrahydrocannabinol NONE DETECTED NONE DETECTED   Barbiturates NONE DETECTED NONE DETECTED    Comment: (NOTE) DRUG SCREEN FOR MEDICAL PURPOSES ONLY.  IF CONFIRMATION IS NEEDED FOR ANY PURPOSE, NOTIFY LAB WITHIN 5 DAYS. LOWEST DETECTABLE LIMITS FOR URINE DRUG SCREEN Drug Class  Cutoff (ng/mL) Amphetamine and metabolites    1000 Barbiturate and metabolites    200 Benzodiazepine                 200 Tricyclics and metabolites     300 Opiates and metabolites        300 Cocaine and metabolites        300 THC                            50 Performed at [facility], [address] [place], [place] [id number]     Blood Alcohol level:  Lab Results  Component Value Date   ETH 15 (H) 11/05/2018   ETH <5 09/19/2016    Metabolic Disorder Labs: Lab Results  Component Value Date   HGBA1C 5.2 01/04/2018   MPG 102.54 01/04/2018   No results found for: PROLACTIN Lab Results  Component Value Date   CHOL 205 (H) 01/04/2018   TRIG 99 01/04/2018   HDL 48 01/04/2018   CHOLHDL 4.3 01/04/2018   VLDL 20 01/04/2018   LDLCALC 137 (H) 01/04/2018    Physical Findings: AIMS: Facial and Oral Movements Muscles of Facial Expression: None, normal Lips and Perioral Area: None, normal Jaw: None, normal Tongue: None, normal,Extremity Movements Upper (arms, wrists, hands, fingers): None, normal Lower (legs, knees, ankles, toes): None, normal, Trunk Movements Neck, shoulders, hips: None, normal, Overall Severity Severity of abnormal movements (highest score from questions above): None, normal Incapacitation due to abnormal movements: None, normal Patient's awareness of abnormal  movements (rate only patient's report): No Awareness, Dental Status Current problems with teeth and/or dentures?: No Does patient usually wear dentures?: No  CIWA:    COWS:  COWS Total Score: 1  Musculoskeletal: Strength & Muscle Tone: within normal limits Gait & Station: normal Patient leans: N/A  Psychiatric Specialty Exam: Physical Exam  Psychiatric: She has a normal mood and affect. Her behavior is normal.    Review of Systems  Skin:       Facial tattoos noted  Psychiatric/Behavioral: Negative for depression and suicidal ideas. The patient is nervous/anxious.     Blood pressure 95/64, pulse 100, temperature 97.7 F (36.5 C), temperature source Oral, resp. rate 18, height 5\' 1"  (1.549 m), weight 47.6 kg, SpO2 99 %, unknown if currently breastfeeding.Body mass index is 19.84 kg/m.  General Appearance: Casual  Eye Contact:  Good  Speech:  Clear and Coherent  Volume:  Normal  Mood:  Anxious and Depressed  Affect:  Appropriate  Thought Process:  Coherent  Orientation:  Full (Time, Place, and Person)  Thought Content:  Logical  Suicidal Thoughts:  No  Homicidal Thoughts:  No  Memory:  Immediate;   Fair Recent;   Fair Remote;   Fair  Judgement:  Fair  Insight:  Fair  Psychomotor Activity:  Normal  Concentration:  Concentration: Fair  Recall:  Fis[employer]Fair  Fund of Knowledge:  Fair  Language:  Fair  Akathisia:  No  Handed:  Right  AIMS (if indicated):     Assets:  Communication Skills Desire for Improvement Resilience Social Support  ADL's:  Intact  Cognition:  WNL  Sleep:  Number of Hours: 6.75     Treatment Plan Summary: Daily contact with patient to assess and evaluate symptoms and progress in treatment and Medication management   Continue her current treatment plan on 11/07/2018 as listed below except as noted  Substance-induced mood disorder:  Continue Neurontin  300 mg p.o. 3 times daily Continue CoWs/detox protocol  CSW to continue working on discharge  disposition Patient encouraged to participate within the therapeutic milieu    Oneta Rack, NP 11/07/2018, 10:45 AM

## 2018-11-07 NOTE — [facility] Suicide Risk Assessment (Signed)
[facility] INPATIENT:  Family/Significant Other Suicide Prevention Education  Suicide Prevention Education:  Patient Refusal for Family/Significant Other Suicide Prevention Education: The patient Carolyn [name] has refused to provide written consent for family/significant other to be provided Family/Significant Other Suicide Prevention Education during admission and/or prior to discharge.  Physician notified.  Suicide Prevention Education was reviewed thoroughly with patient, including risk factors, warning signs, and what to do.  Mobile Crisis services were described and that telephone number pointed out, with encouragement to patient to put this number in personal cell phone.  Brochure was provided to patient to share with natural supports.  Patient acknowledged the ways in which they are at risk, and how working through each of their issues can gradually start to reduce their risk factors.  Patient was encouraged to think of the information in the context of people in their own lives.  Patient denied having access to firearms  Patient verbalized understanding of information provided.  Patient endorsed a desire to live.  She was emphatic that she had at no point been suicidal prior to her admission.      Carloyn Jaeger Grossman-Orr 11/07/2018, 1:04 PM

## 2018-11-07 NOTE — Progress Notes (Signed)
D. Pt observed interacting appropriately with peers and staff.. calm and cooperative behavior. Pt currently denies pain, withdrawal symptoms, SI/HI and AVH   A. Labs and vitals monitored. Pt compliant with medications. Pt supported emotionally and encouraged to express concerns and ask questions.   R. Pt remains safe with 15 minute checks. Will continue POC.

## 2018-11-07 NOTE — [facility] Group Notes (Signed)
[facility] LCSW Group Therapy Note  Date/Time:  11/07/2018 9:00-10:00 or 10:00-11:00AM  Type of Therapy and Topic:  Group Therapy:  Healthy and Unhealthy Supports  Participation Level:  Active   Description of Group:  Patients in this group were introduced to the idea of adding a variety of healthy supports to address the various needs in their lives.Patients discussed what additional healthy supports could be helpful in their recovery and wellness after discharge in order to prevent future hospitalizations.   An emphasis was placed on using counselor, doctor, therapy groups, 12-step groups, and problem-specific support groups to expand supports.  They also worked as a group on developing a specific plan for several patients to deal with unhealthy supports through boundary-setting, psychoeducation with loved ones, and even termination of relationships.   Therapeutic Goals:   1)  discuss importance of adding supports to stay well once out of the hospital  2)  compare healthy versus unhealthy supports and identify some examples of each  3)  generate ideas and descriptions of healthy supports that can be added  4)  offer mutual support about how to address unhealthy supports  5)  encourage active participation in and adherence to discharge plan    Summary of Patient Progress:  The patient stated that current healthy supports in her life are   friends and her boyfriendwhile current unhealthy supports include her mother who states is a trigger for her. The patient expressed a willingness to add therapy as a support to help in her recovery journey.   Therapeutic Modalities:   Motivational Interviewing Brief Solution-Focused Therapy  Evorn Gong

## 2018-11-07 NOTE — [facility] Counselor (Signed)
Adult Comprehensive Assessment  Patient ID: Carolyn [name], female   DOB: 30-Jul-1995, 23 y.o.   MRN: [id number]  Information Source: Information source: Patient  Current Stressors:  Patient states their primary concerns and needs for treatment are:: "My overdose was thought to be intentional.  I have substance abuse issues." Patient states their goals for this hospitilization and ongoing recovery are:: "Reat, eat, relax, detox if needed." Educational / Learning stressors: Denies stressors Employment / Job issues: Has not had a job in [employer], hard to get a job due to coronavirus. Family Relationships: Mother is overbearing and overprotective.  Pt has removed mother from her life for "awhile."  States that mother is "almost a Programmer, multimedia / Lack of resources (include bankruptcy): A little, not much.  Staying with people who are paying her way without issue. Housing / Lack of housing: Denies stressors Physical health (include injuries & life threatening diseases): Has Hepatitis C, knows she will eventually need treatment. Social relationships: Denies stressors Substance abuse: States substance abuse "was" an issue, but has had "an awakening that I don't need to use drugs to be happy." Bereavement / Loss: Denies stressors  Living/Environment/Situation:  Living Arrangements: Other relatives, Non-relatives/Friends Living conditions (as described by patient or guardian): She and boyfriend share a room and bathroom Who else lives in the home?: Boyfriend, cousin and cousin's boyfriend How long has patient lived in current situation?: 1 month What is atmosphere in current home: Comfortable, Quarry manager, Supportive  Family History:  Marital status: Long term relationship Long term relationship, how long?: 3 months What types of issues is patient dealing with in the relationship?: None - he also used substances but has stopped Are you sexually active?: Yes What is your sexual orientation?:  Bisexual Has your sexual activity been affected by drugs, alcohol, medication, or emotional stress?: No Does patient have children?: Yes How many children?: 2 How is patient's relationship with their children?: 54yo son - has full custody of her son, but he is living with her mother and because she is estranged from her mother, has not seen her son in over a year.  Talks to him on the phone.  Has a son who is "1-1/2 to 2yo" and she gave him up for an open adoption, has had no contact.  Childhood History:  By whom was/is the patient raised?: Mother, Grandparents Additional childhood history information: Father was never in the picture.  Lived with mother until 25yo, but because of mother's drug use, was taken in by her grandparents. Description of patient's relationship with caregiver when they were a child: Mother - does not remember, was too young; Grandparents - good with both Patient's description of current relationship with people who raised him/her: Mother - estranged; Grandmother - estranged; Grandfather - distant How were you disciplined when you got in trouble as a child/adolescent?: Things were taken away, spankings Does patient have siblings?: Yes Number of Siblings: 3 Description of patient's current relationship with siblings: All are half siblings younger than her, with okay relationships, but not close Did patient suffer any verbal/emotional/physical/sexual abuse as a child?: No Did patient suffer from severe childhood neglect?: Yes Patient description of severe childhood neglect: Would go without food and needs being met.  This is why at age 18yo, she went to live with grandparents. 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 Did You Receive Any Psychiatric Treatment/Services While in Passenger transport manager?: (No Armed forces logistics/support/administrative officer) Are There Guns or Other Weapons in [place]?: No  Financial Resources:   Financial resources: No income Does patient have a Programmer, applications or guardian?: No  Alcohol/Substance Abuse:   What has been your use of drugs/alcohol within the last 12 months?: Heroin and meth, which she states she uses "off and on" throughout the month, averaging approximately 14 days total per month If attempted suicide, did drugs/alcohol play a role in this?: Yes Alcohol/Substance Abuse Treatment Hx: Past Tx, Inpatient If yes, describe treatment: [facility], halfway house, has been to AA/NA in the past but not currently Has alcohol/substance abuse ever caused legal problems?: Yes(Has May 2020 court date on possession charge.)  Social Support System:   [employer] Support System: Manufacturing engineer System: Boyfriend, cousin, cousin's boyfriend Type of faith/religion: Occupational hygienist, believes in God How does patient's faith help to cope with current illness?: Has hope  Leisure/Recreation:   Leisure and Hobbies: Watch TV, adult coloring, outside activities  Strengths/Needs:   What is the patient's perception of their strengths?: Good leader, funny, genuine, "my own person" Patient states they can use these personal strengths during their treatment to contribute to their recovery: "Keep my mind on the ultimate goal, push myself to stay clean, get a job." Patient states these barriers may affect/interfere with their treatment: None Patient states these barriers may affect their return to the community: None Other important information  patient would like considered in planning for their treatment: None  Discharge Plan:   Currently receiving community mental health services: No Patient states concerns and preferences for aftercare planning are: Declines follow-up, states she does not need it. Patient states they will know when they are safe and ready for discharge when: "Now" - states she has felt ready since she got here.  States that was no suicidal, ideation, not even in the beginning. Does patient have access to transportation?: No Does patient have financial barriers related to discharge medications?: Yes Patient description of barriers related to discharge medications: Has no income, no insurance Plan for no access to transportation at discharge: States she will need to take the bus, and will need information about which bus [place]. Will patient be returning to same living situation after discharge?: Yes  Summary/Recommendations:   Summary and Recommendations (to be completed by the evaluator): Patient is a 23 y.o. female who presents to the ED voluntarily following an unintentional OD. Pt was reportedly found unconscious in a gas station parking lot and was administered 5 mg of Narcan. Pt states she has overdosed "a couple of times" in the past but denies any of them to be intentional. Pt denies SI to this Probation officer however she informed ED staff that she has been suicidal over the past week and has been engaging in self-harm including cutting as recently as a few days ago. Pt states she "either eats too much or not at all" and "sleeps all day long or will stay awake for days at a time."  Primary stressors include family conflict, unemployment, upcoming court appearance, and separation from children.  She reports using heroin and methamphetamines about 14 days a month, off and on throughout the month.  Patient will benefit from crisis stabilization, medication evaluation, group therapy and psychoeducation, in  addition to case management for discharge planning. At discharge it is recommended that Patient adhere to the established discharge plan and continue in treatment.  Maretta Los. 11/07/2018

## 2018-11-07 NOTE — Progress Notes (Addendum)
D   Pt is pleasant on approach and cooperative   She refused her clonidine this evening    She attended group A   Verbal support given   Medications administered and effectiveness monitored    Q 15 min checks R   Pt is safe at this time  [place] NOVEL CORONAVIRUS (COVID-19) DAILY CHECK-OFF SYMPTOMS - answer yes or no to each - every day NO YES  Have you had a fever in the past 24 hours?  . Fever (Temp > 37.80C / 100F) X   Have you had any of these symptoms in the past 24 hours? . New Cough .  Sore Throat  .  Shortness of Breath .  Difficulty Breathing .  Unexplained Body Aches   X   Have you had any one of these symptoms in the past 24 hours not related to allergies?   . Runny Nose .  Nasal Congestion .  Sneezing   X   If you have had runny nose, nasal congestion, sneezing in the past 24 hours, has it worsened?  X   EXPOSURES - check yes or no X   Have you traveled outside the state in the past 14 days?  X   Have you been in contact with someone with a confirmed diagnosis of COVID-19 or PUI in the past 14 days without wearing appropriate PPE?  X   Have you been living in the same home as a person with confirmed diagnosis of COVID-19 or a PUI (household contact)?    X   Have you been diagnosed with COVID-19?    X              What to do next: Answered NO to all: Answered YES to anything:   Proceed with unit schedule Follow the [facility] Inpatient Flowsheet.

## 2018-11-08 DIAGNOSIS — F1110 Opioid abuse, uncomplicated: Secondary | ICD-10-CM

## 2018-11-08 MED ORDER — HYDROXYZINE HCL 25 MG PO TABS: 25.0000 mg | ORAL_TABLET | Freq: Four times a day (QID) | ORAL | 0 refills | Status: DC | PRN

## 2018-11-08 MED ORDER — GABAPENTIN 300 MG PO CAPS: 300.0000 mg | ORAL_CAPSULE | Freq: Three times a day (TID) | ORAL | 0 refills | Status: DC

## 2018-11-08 NOTE — Progress Notes (Signed)
  [facility] Adult Case Management Discharge Plan :  Will you be returning to the same living situation after discharge:  Yes,  with friends At discharge, do you have transportation home?: No. Pt will use the bus.  No fare currently. Do you have the ability to pay for your medications: No. Pt declining any meds.  Release of information consent forms completed and in the chart;  Patient's signature needed at discharge.  Patient to Follow up at: Follow-up Information    Patient declines any follow-up referrals. Follow up.           Next level of care provider has access to [facility]:no  Safety Planning and Suicide Prevention discussed: No. Pt declined consent.     Has patient been referred to the Quitline?: N/A patient is not a smoker  Patient has been referred for addiction treatment: Pt. refused referral  Lorri Frederick, LCSW 11/08/2018, 9:56 AM

## 2018-11-08 NOTE — Progress Notes (Signed)
Pt attended spiritual care group on grief and loss facilitated by chaplain Burnis Kingfisher   Group opened with brief discussion and psycho-social ed around grief and loss in relationships and in relation to self - identifying life patterns, circumstances, changes that cause losses. Established group norm of speaking from own life experience. Group goal of establishing open and affirming space for members to share loss and experience with grief, normalize grief experience and provide psycho social education and grief support.    Carolyn [name] was present throughout group.  She was engaged with other group members in conversation prior to and following group.  She did not engage in group discussion     Burnis Kingfisher, MDiv, [facility]

## 2018-11-08 NOTE — Discharge Summary (Signed)
Physician Discharge Summary Note  Patient:  Carolyn [name] is an 23 y.o., female MRN:  [id number] DOB:  05-Feb-1996 Patient phone:  [phone] (home)  [place]   [address] [place] [id number],  Total Time spent with patient: 15 minutes  Date of Admission:  11/06/2018 Date of Discharge: 11/08/18  Reason for Admission:  Heroin overdose  Principal Problem: Major depressive disorder, recurrent severe without psychotic features [facility](HCC) Discharge Diagnoses: Principal Problem:   Major depressive disorder, recurrent severe without psychotic features (HCC) Active Problems:   Amphetamine abuse (HCC)   Heroin abuse (HCC)   Past Psychiatric History: Per admission H&P: Reports she was prescribed Zoloft and Prozac in the past by her PCP.  Denies previous inpatient admission and/or treatment/rehabilitation programs.  Past Medical History:  Past Medical History:  Diagnosis Date  . Allergy   . Anxiety   . Asthma    childhood  . Depression   . Drug overdose 11/05/2018  . Hepatitis C 12/2017  . Pneumonia   . Renal disorder    History reviewed. No pertinent surgical history. Family History:  Family History  Problem Relation Age of Onset  . Hypertension Father   . Diabetes Father    Family Psychiatric  History: Denies Social History:  Social History   Substance and Sexual Activity  Alcohol Use Not Currently  . Alcohol/week: 4.0 standard drinks  . Types: 4 Cans of beer per week   Comment: never heavy drinker     Social History   Substance and Sexual Activity  Drug Use Yes  . Types: Methamphetamines, Heroin    Social History   Socioeconomic History  . Marital status: Single    Spouse name: Not on file  . Number of children: Not on file  . Years of education: Not on file  . Highest education level: Not on file  Occupational History  . Not on file  Social Needs  . Financial resource strain: Not on file  . Food insecurity:    Worry: Not on file    Inability: Not on  file  . Transportation needs:    Medical: Not on file    Non-medical: Not on file  Tobacco Use  . Smoking status: Former Smoker    Packs/day: 1.00    Years: 7.00    Pack years: 7.00    Types: Cigarettes, E-cigarettes  . Smokeless tobacco: Never Used  Substance and Sexual Activity  . Alcohol use: Not Currently    Alcohol/week: 4.0 standard drinks    Types: 4 Cans of beer per week    Comment: never heavy drinker  . Drug use: Yes    Types: Methamphetamines, Heroin  . Sexual activity: Yes    Birth control/protection: None  Lifestyle  . Physical activity:    Days per week: Not on file    Minutes per session: Not on file  . Stress: Not on file  Relationships  . Social connections:    Talks on phone: Not on file    Gets together: Not on file    Attends religious service: Not on file    Active member of club or organization: Not on file    Attends meetings of clubs or organizations: Not on file    Relationship status: Not on file  Other Topics Concern  . Not on file  Social History Narrative  . Not on file    Hospital Course:  From admission SRA: Patient is a 23 year old female with a past psychiatric history significant for opioid  dependence and methamphetamine dependence who presented to the [facility] emergency department on 11/06/2018 after an unintentional overdose of heroin.  The patient was found in a gas station parking lot.  She was unconscious.  She was given 5 mg of Narcan.  The patient denies that this was a suicide attempt.  She stated that she has overdosed several times in the past, and each time it is been unintentional.  She denied any suicidal ideation, but had spoken to the emergency department staff about being suicidal over the last week or so.  The patient stated 1 of her biggest stressors is the fact that she has not worked in several months to over a year.  She lives with friends, and was recently discharged from jail.  She would not discuss the  circumstances of her arrest that led to jail.  She denied any desire for substance rehabilitation, or treatment for her drug problem.  She stated that in the last 90 days that she has gone at least 30 days without using any substances.  She was admitted to the hospital for evaluation and stabilization.  Ms. Blowe was admitted after unintentional heroin overdose requiring Narcan. She denied suicidal intent behind the overdose but reported suicidal ideation to emergency department staff. UDS was positive for amphetamines with BAL of 15. She denied regular alcohol use. She denied SI on admission to [facility]. She was started on clonidine COWS protocol. She participated in group therapy on the unit. She denied withdrawal symptoms. She denied SI throughout admission to [facility]. She remained on the [facility] unit for 2 days. She was discharged on the medications listed below. She has shown stable mood, affect, sleep, appetite, and interaction. She denies any SI/HI/AVH and contracts for safety. The patient declined referrals for follow-up care. She is provided with prescriptions for medications upon discharge. She is discharging home with friends via bus.  Physical Findings: AIMS: Facial and Oral Movements Muscles of Facial Expression: None, normal Lips and Perioral Area: None, normal Jaw: None, normal Tongue: None, normal,Extremity Movements Upper (arms, wrists, hands, fingers): None, normal Lower (legs, knees, ankles, toes): None, normal, Trunk Movements Neck, shoulders, hips: None, normal, Overall Severity Severity of abnormal movements (highest score from questions above): None, normal Incapacitation due to abnormal movements: None, normal Patient's awareness of abnormal movements (rate only patient's report): No Awareness, Dental Status Current problems with teeth and/or dentures?: No Does patient usually wear dentures?: No  CIWA:    COWS:  COWS Total Score: 0  Musculoskeletal: Strength & Muscle Tone: within  normal limits Gait & Station: normal Patient leans: N/A  Psychiatric Specialty Exam: Physical Exam  Nursing note and vitals reviewed. Constitutional: She is oriented to person, place, and time. She appears well-developed and well-nourished.  Cardiovascular: Normal rate.  Respiratory: Effort normal.  Neurological: She is alert and oriented to person, place, and time.    Review of Systems  Constitutional: Negative.   Psychiatric/Behavioral: Positive for substance abuse (heroin, amphetamines). Negative for depression, hallucinations and suicidal ideas. The patient is not nervous/anxious and does not have insomnia.     Blood pressure 110/80, pulse 90, temperature 98.4 F (36.9 C), temperature source Oral, resp. rate 18, height  (1.549 m), weight 47.6 kg, SpO2 99 %, unknown if currently breastfeeding.Body mass index is 19.84 kg/m.  See MD's discharge SRA        Has this patient used any form of tobacco in the last 30 days? (Cigarettes, Smokeless Tobacco, Cigars, and/or Pipes)  No  Blood Alcohol level:  Lab Results  Component Value Date   ETH 15 (H) 11/05/2018   ETH <5 09/19/2016    Metabolic Disorder Labs:  Lab Results  Component Value Date   HGBA1C 5.2 01/04/2018   MPG 102.54 01/04/2018   No results found for: PROLACTIN Lab Results  Component Value Date   CHOL 205 (H) 01/04/2018   TRIG 99 01/04/2018   HDL 48 01/04/2018   CHOLHDL 4.3 01/04/2018   VLDL 20 01/04/2018   LDLCALC 137 (H) 01/04/2018    See Psychiatric Specialty Exam and Suicide Risk Assessment completed by Attending Physician prior to discharge.  Discharge destination:  Home  Is patient on multiple antipsychotic therapies at discharge:  No   Has Patient had three or more failed trials of antipsychotic monotherapy by history:  No  Recommended Plan for Multiple Antipsychotic Therapies: NA  Discharge Instructions    Discharge instructions   Complete by:  As directed    Patient is instructed to  take all prescribed medications as recommended. Report any side effects or adverse reactions to your outpatient psychiatrist. Patient is instructed to abstain from alcohol and illegal drugs while on prescription medications. In the event of worsening symptoms, patient is instructed to call the crisis hotline, 911, or go to the nearest emergency department for evaluation and treatment.     Allergies as of 11/08/2018      Reactions   Advair Diskus [fluticasone-salmeterol] Other (See Comments)   Pt states she "blacked out"   Sulfa Antibiotics Other (See Comments)   Infant (unk)   Prednisone    Made her bonkers blackouts   Banana Itching      Medication List    STOP taking these medications   cephALEXin 500 MG capsule Commonly known as:  KEFLEX   ciprofloxacin 500 MG tablet Commonly known as:  CIPRO     TAKE these medications     Indication  gabapentin 300 MG capsule Commonly known as:  NEURONTIN Take 1 capsule (300 mg total) by mouth 3 (three) times daily.  Indication:  Neuropathic Pain   hydrOXYzine 25 MG tablet Commonly known as:  ATARAX/VISTARIL Take 1 tablet (25 mg total) by mouth every 6 (six) hours as needed for anxiety.  Indication:  Feeling Anxious      Follow-up Information    Patient declines any follow-up referrals. Follow up.           Follow-up recommendations: Activity as tolerated. Diet as recommended by primary care physician. Keep all scheduled follow-up appointments as recommended.  Comments:   Patient is instructed to take all prescribed medications as recommended. Report any side effects or adverse reactions to your outpatient psychiatrist. Patient is instructed to abstain from alcohol and illegal drugs while on prescription medications. In the event of worsening symptoms, patient is instructed to call the crisis hotline, 911, or go to the nearest emergency department for evaluation and treatment.  Signed: Aldean Baker, NP 11/08/2018, 11:58  AM

## 2018-11-08 NOTE — [facility] Suicide Risk Assessment (Signed)
[facility] Discharge Suicide Risk Assessment   Principal Problem: <principal problem not specified> Discharge Diagnoses: Active Problems:   Major depressive disorder, recurrent severe without psychotic features (HCC)   Total Time spent with patient: 15 minutes  Musculoskeletal: Strength & Muscle Tone: within normal limits Gait & Station: normal Patient leans: N/A  Psychiatric Specialty Exam: Review of Systems  All other systems reviewed and are negative.   Blood pressure 110/80, pulse 90, temperature 98.4 F (36.9 C), temperature source Oral, resp. rate 18, height 5\' 1"  (1.549 m), weight 47.6 kg, SpO2 99 %, unknown if currently breastfeeding.Body mass index is 19.84 kg/m.  General Appearance: Casual  Eye Contact::  Good  Speech:  Normal Rate409  Volume:  Normal  Mood:  Euthymic  Affect:  Congruent  Thought Process:  Coherent and Descriptions of Associations: Intact  Orientation:  Full (Time, Place, and Person)  Thought Content:  Logical  Suicidal Thoughts:  No  Homicidal Thoughts:  No  Memory:  Immediate;   Fair Recent;   Fair Remote;   Fair  Judgement:  Intact  Insight:  Fair  Psychomotor Activity:  Normal  Concentration:  Fair  Recall:  [employer] of Knowledge:Fair  Language: Good  Akathisia:  Negative  Handed:  Right  AIMS (if indicated):     Assets:  Desire for Improvement Resilience  Sleep:  Number of Hours: 6  Cognition: WNL  ADL's:  Intact   Mental Status Per Nursing Assessment::   On Admission:  Self-harm behaviors  Demographic Factors:  Adolescent or young adult, Caucasian and Low socioeconomic status  Loss Factors: NA  Historical Factors: Impulsivity  Risk Reduction Factors:   Positive coping skills or problem solving skills  Continued Clinical Symptoms:  Depression:   Comorbid alcohol abuse/dependence Impulsivity Alcohol/Substance Abuse/Dependencies  Cognitive Features That Contribute To Risk:  None    Suicide Risk:  Minimal: No  identifiable suicidal ideation.  Patients presenting with no risk factors but with morbid ruminations; may be classified as minimal risk based on the severity of the depressive symptoms  Follow-up Information    Patient declines any follow-up referrals. Follow up.           Plan Of Care/Follow-up recommendations:  Activity:  ad lib  Antonieta Pert, MD 11/08/2018, 8:08 AM

## 2018-11-08 NOTE — Progress Notes (Signed)
Patient ID: Carolyn [name], female   DOB: 10/01/1995, 23 y.o.   MRN: [id number] Pt d/c to home via bus then bf. Medications and discharge instructions reviewed. Pt vebralizes understanding.

## 2018-11-08 NOTE — Progress Notes (Signed)
Recreation Therapy Notes  Date:  4.27.20 Time: 0930 Location: [address] Dayroom  Group Topic: Stress Management  Goal Area(s) Addresses:  Patient will identify positive stress management techniques. Patient will identify benefits of using stress management post d/c.  Intervention: Stress Management  Activity :  Meditation.  LRT introduced the stress management technique of meditation.  LRT played a meditation that dealt with being resilient in the face of adversity.  Patients were to listen and follow along as meditation played.  Education:  Stress Management, Discharge Planning.   Education Outcome: Acknowledges Education  Clinical Observations/Feedback:  Pt did not attend group.     Caroll Rancher, LRT/CTRS    Lillia Abed, Ruchy Wildrick A 11/08/2018 11:01 AM

## 2018-11-08 NOTE — Progress Notes (Signed)
Patient ID: Carolyn [name], female   DOB: 19-May-1996, 23 y.o.   MRN: [id number] [place] NOVEL CORONAVIRUS (COVID-19) DAILY CHECK-OFF SYMPTOMS - answer yes or no to each - every day NO YES  Have you had a fever in the past 24 hours?  . Fever (Temp > 37.80C / 100F) X   Have you had any of these symptoms in the past 24 hours? . New Cough .  Sore Throat  .  Shortness of Breath .  Difficulty Breathing .  Unexplained Body Aches   X   Have you had any one of these symptoms in the past 24 hours not related to allergies?   . Runny Nose .  Nasal Congestion .  Sneezing   X   If you have had runny nose, nasal congestion, sneezing in the past 24 hours, has it worsened?  X   EXPOSURES - check yes or no X   Have you traveled outside the state in the past 14 days?  X   Have you been in contact with someone with a confirmed diagnosis of COVID-19 or PUI in the past 14 days without wearing appropriate PPE?  X   Have you been living in the same home as a person with confirmed diagnosis of COVID-19 or a PUI (household contact)?    X   Have you been diagnosed with COVID-19?    X              What to do next: Answered NO to all: Answered YES to anything:   Proceed with unit schedule Follow the [facility] Inpatient Flowsheet.

## 2018-11-08 NOTE — Tx Team (Signed)
Interdisciplinary Treatment and Diagnostic Plan Update  11/08/2018 Time of Session: 0853 Carolyn [name] MRN: [id number]  Principal Diagnosis: <principal problem not specified>  Secondary Diagnoses: Active Problems:   Major depressive disorder, recurrent severe without psychotic features (HCC)   Current Medications:  Current Facility-Administered Medications  Medication Dose Route Frequency Provider Last Rate Last Dose  . acetaminophen (TYLENOL) tablet 650 mg  650 mg Oral Q6H PRN Antonieta Pert, MD      . alum & mag hydroxide-simeth (MAALOX/MYLANTA) 200-200-20 MG/5ML suspension 30 mL  30 mL Oral Q4H PRN Antonieta Pert, MD      . cloNIDine (CATAPRES) tablet 0.1 mg  0.1 mg Oral QID Oneta Rack, NP   0.1 mg at 11/06/18 1509   Followed by  . cloNIDine (CATAPRES) tablet 0.1 mg  0.1 mg Oral Esperanza Heir, NP       Followed by  . [START ON 11/11/2018] cloNIDine (CATAPRES) tablet 0.1 mg  0.1 mg Oral QAC breakfast Oneta Rack, NP      . dicyclomine (BENTYL) tablet 20 mg  20 mg Oral Q6H PRN Oneta Rack, NP      . gabapentin (NEURONTIN) capsule 300 mg  300 mg Oral TID Antonieta Pert, MD   300 mg at 11/08/18 0908  . hydrOXYzine (ATARAX/VISTARIL) tablet 25 mg  25 mg Oral Q6H PRN Oneta Rack, NP   25 mg at 11/07/18 2128  . loperamide (IMODIUM) capsule 2-4 mg  2-4 mg Oral PRN Oneta Rack, NP      . LORazepam (ATIVAN) tablet 1 mg  1 mg Oral Q6H PRN Antonieta Pert, MD      . magnesium hydroxide (MILK OF MAGNESIA) suspension 30 mL  30 mL Oral Daily PRN Antonieta Pert, MD      . methocarbamol (ROBAXIN) tablet 500 mg  500 mg Oral Q8H PRN Oneta Rack, NP      . naproxen (NAPROSYN) tablet 500 mg  500 mg Oral BID PRN Oneta Rack, NP      . ondansetron (ZOFRAN-ODT) disintegrating tablet 4 mg  4 mg Oral Q6H PRN Oneta Rack, NP      . pneumococcal 23 valent vaccine (PNU-IMMUNE) injection 0.5 mL  0.5 mL Intramuscular Tomorrow-1000 Jola Babinski Marlane Mingle, MD        PTA Medications: Medications Prior to Admission  Medication Sig Dispense Refill Last Dose  . cephALEXin (KEFLEX) 500 MG capsule Take 1 capsule (500 mg total) by mouth 4 (four) times daily. For 7 days (Patient not taking: Reported on 03/07/2018) 28 capsule 0 Not Taking at Unknown time  . ciprofloxacin (CIPRO) 500 MG tablet Take 1 tablet (500 mg total) by mouth 2 (two) times daily. (Patient not taking: Reported on 11/05/2018) 14 tablet 0 Not Taking at Unknown time    Patient Stressors: Financial difficulties Health problems Substance abuse  Patient Strengths: Ability for insight Average or above average intelligence Capable of independent living Motivation for treatment/growth Physical Health Supportive family/friends  Treatment Modalities: Medication Management, Group therapy, Case management,  1 to 1 session with clinician, Psychoeducation, Recreational therapy.   Physician Treatment Plan for Primary Diagnosis: <principal problem not specified> Long Term Goal(s): Improvement in symptoms so as ready for discharge Improvement in symptoms so as ready for discharge   Short Term Goals: Ability to identify changes in lifestyle to reduce recurrence of condition will improve Ability to demonstrate self-control will improve Ability to maintain clinical measurements within normal limits will  improve Compliance with prescribed medications will improve Ability to identify changes in lifestyle to reduce recurrence of condition will improve Ability to verbalize feelings will improve Ability to demonstrate self-control will improve Compliance with prescribed medications will improve  Medication Management: Evaluate patient's response, side effects, and tolerance of medication regimen.  Therapeutic Interventions: 1 to 1 sessions, Unit Group sessions and Medication administration.  Evaluation of Outcomes: Adequate for Discharge  Physician Treatment Plan for Secondary Diagnosis: Active  Problems:   Major depressive disorder, recurrent severe without psychotic features (HCC)  Long Term Goal(s): Improvement in symptoms so as ready for discharge Improvement in symptoms so as ready for discharge   Short Term Goals: Ability to identify changes in lifestyle to reduce recurrence of condition will improve Ability to demonstrate self-control will improve Ability to maintain clinical measurements within normal limits will improve Compliance with prescribed medications will improve Ability to identify changes in lifestyle to reduce recurrence of condition will improve Ability to verbalize feelings will improve Ability to demonstrate self-control will improve Compliance with prescribed medications will improve     Medication Management: Evaluate patient's response, side effects, and tolerance of medication regimen.  Therapeutic Interventions: 1 to 1 sessions, Unit Group sessions and Medication administration.  Evaluation of Outcomes: Adequate for Discharge   RN Treatment Plan for Primary Diagnosis: <principal problem not specified> Long Term Goal(s): Knowledge of disease and therapeutic regimen to maintain health will improve  Short Term Goals: Ability to participate in decision making will improve, Ability to verbalize feelings will improve, Ability to disclose and discuss suicidal ideas, Ability to identify and develop effective coping behaviors will improve and Compliance with prescribed medications will improve  Medication Management: RN will administer medications as ordered by provider, will assess and evaluate patient's response and provide education to patient for prescribed medication. RN will report any adverse and/or side effects to prescribing provider.  Therapeutic Interventions: 1 on 1 counseling sessions, Psychoeducation, Medication administration, Evaluate responses to treatment, Monitor vital signs and CBGs as ordered, Perform/monitor CIWA, COWS, AIMS and Fall Risk  screenings as ordered, Perform wound care treatments as ordered.  Evaluation of Outcomes: Adequate for Discharge   LCSW Treatment Plan for Primary Diagnosis: <principal problem not specified> Long Term Goal(s): Safe transition to appropriate next level of care at discharge, Engage patient in therapeutic group addressing interpersonal concerns.  Short Term Goals: Engage patient in aftercare planning with referrals and resources and Increase skills for wellness and recovery  Therapeutic Interventions: Assess for all discharge needs, 1 to 1 time with Social worker, Explore available resources and support systems, Assess for adequacy in community support network, Educate family and significant other(s) on suicide prevention, Complete Psychosocial Assessment, Interpersonal group therapy.  Evaluation of Outcomes: Adequate for Discharge   Progress in Treatment: Attending groups: No. Participating in groups: No. Taking medication as prescribed: No. Toleration medication: No. Family/Significant other contact made: No, will contact:  client denied Patient understands diagnosis: Yes. Discussing patient identified problems/goals with staff: Yes. Medical problems stabilized or resolved: Yes. Denies suicidal/homicidal ideation: Yes. Issues/concerns per patient self-inventory: No. Other:   New problem(s) identified: No, Describe:  None  New Short Term/Long Term Goal(s):  Patient Goals:  "I want to be happy with myself"   Discharge Plan or Barriers:   Reason for Continuation of Hospitalization: Depression Medication stabilization  Estimated Length of Stay: Discharging today  Attendees: Patient: 11/08/2018   Physician:  Dr. Cherylann Ratel, MD 11/08/2018   Nursing:  Dewayne Shorter, RN 11/08/2018  RN Care Manager: 11/08/2018   Social Worker: Stephannie PetersJasmine Nikhil Osei, [place] 11/08/2018   Recreational Therapist:  11/08/2018   Other:  11/08/2018   Other:  11/08/2018   Other: 11/08/2018     Scribe for  Treatment Team: Delphia GratesJasmine M Ryin Schillo, LCSW 11/08/2018 11:21 AM

## 2018-11-09 LAB — HEPATITIS PANEL, ACUTE
HCV Ab: >11 s/co ratio — ABNORMAL HIGH (ref 0.0–0.9)
Hep A IgM: NEGATIVE
Hep B C IgM: NEGATIVE
Hepatitis B Surface Ag: NEGATIVE

## 2018-11-29 ENCOUNTER — Inpatient Hospital Stay (HOSPITAL_COMMUNITY)
Admission: AD | Admit: 2018-11-29 | Discharge: 2018-11-29 | Disposition: A | Discharge status: Home / Self Care | Payer: Medicaid Other | Source: Ambulatory Visit | Attending: Obstetrics & Gynecology | Admitting: Obstetrics & Gynecology

## 2018-11-29 ENCOUNTER — Other Ambulatory Visit: Payer: Self-pay

## 2018-11-29 ENCOUNTER — Encounter (HOSPITAL_COMMUNITY): Payer: Self-pay | Admitting: *Deleted

## 2018-11-29 DIAGNOSIS — T7421XA Adult sexual abuse, confirmed, initial encounter: Secondary | ICD-10-CM

## 2018-11-29 DIAGNOSIS — Z3201 Encounter for pregnancy test, result positive: Secondary | ICD-10-CM | POA: Insufficient documentation

## 2018-11-29 DIAGNOSIS — R102 Pelvic and perineal pain: Secondary | ICD-10-CM | POA: Insufficient documentation

## 2018-11-29 LAB — URINALYSIS, ROUTINE W REFLEX MICROSCOPIC
Bilirubin Urine: NEGATIVE
Glucose, UA: NEGATIVE mg/dL
Hgb urine dipstick: NEGATIVE
Ketones, ur: NEGATIVE mg/dL
Nitrite: NEGATIVE
Protein, ur: NEGATIVE mg/dL
Specific Gravity, Urine: 1.021 (ref 1.005–1.030)
pH: 6 (ref 5.0–8.0)

## 2018-11-29 LAB — POCT PREGNANCY, URINE: Preg Test, Ur: POSITIVE — AB

## 2018-11-29 MED ORDER — AZITHROMYCIN 250 MG PO TABS
1000.0000 mg | ORAL_TABLET | Freq: Once | ORAL | Status: AC
  Administered 2018-11-29: 20:00:00 1000 mg via ORAL
  Filled 2018-11-29: qty 4

## 2018-11-29 MED ORDER — METRONIDAZOLE 500 MG PO TABS: 2000.0000 mg | ORAL_TABLET | Freq: Once | ORAL | 0 refills | Status: AC

## 2018-11-29 MED ORDER — CEFTRIAXONE SODIUM 250 MG IJ SOLR
250.0000 mg | Freq: Once | INTRAMUSCULAR | Status: AC
  Administered 2018-11-29: 250 mg via INTRAMUSCULAR
  Filled 2018-11-29: qty 250
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
## 2018-11-29 NOTE — Discharge Instructions (Signed)
[facility]  ° ° ° °[facility]     Phone: [phone] ° °[facility]  Phone: [phone] ° °[facility]  Phone: [phone] ° °[facility]                           Phone: [phone] ° °[facility]          Phone: [phone] ° °[facility]    Phone: [phone]  ° °[facility] ()    Phone: [phone] ° °[facility]    Phone: [phone] ° °[facility]    Phone: [phone] ° °[facility]    Phone: [phone] ° °[facility]  Phone: [phone] ° °[facility]               Phone: [phone] ° °[facility]    Phone: [phone] ° °[facility]    Phone: [phone] ° ° ° ° ° ° ° ° °  ° ° ° °  ° ° °
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
## 2018-11-30 ENCOUNTER — Other Ambulatory Visit: Payer: Medicaid Other | Admitting: Women's Health

## 2018-11-30 MED ORDER — METRONIDAZOLE 500 MG PO TABS: 2000.0000 mg | ORAL_TABLET | Freq: Once | ORAL | 0 refills | Status: AC

## 2018-11-30 NOTE — Progress Notes (Signed)
Pt called pharmacy who said they never received the RX sent for Flagyl. Resending to alternative pharmacy closer to pt's home at pt request.  Marylen Ponto, NP  10:59 AM 11/30/2018

## 2018-12-05 ENCOUNTER — Emergency Department (HOSPITAL_COMMUNITY)
Admission: EM | Admit: 2018-12-05 | Discharge: 2018-12-05 | Disposition: A | Discharge status: Home / Self Care | Payer: Medicaid Other | Attending: Emergency Medicine | Admitting: Emergency Medicine

## 2018-12-05 ENCOUNTER — Encounter (HOSPITAL_COMMUNITY): Payer: Self-pay

## 2018-12-05 ENCOUNTER — Other Ambulatory Visit: Payer: Self-pay

## 2018-12-05 DIAGNOSIS — T40601A Poisoning by unspecified narcotics, accidental (unintentional), initial encounter: Secondary | ICD-10-CM | POA: Insufficient documentation

## 2018-12-05 DIAGNOSIS — F1990 Other psychoactive substance use, unspecified, uncomplicated: Secondary | ICD-10-CM

## 2018-12-05 DIAGNOSIS — Z87891 Personal history of nicotine dependence: Secondary | ICD-10-CM | POA: Insufficient documentation

## 2018-12-05 DIAGNOSIS — J45909 Unspecified asthma, uncomplicated: Secondary | ICD-10-CM | POA: Insufficient documentation

## 2018-12-05 HISTORY — DX: Other psychoactive substance use, unspecified, uncomplicated: F19.90

## 2018-12-05 LAB — I-STAT BETA HCG BLOOD, ED (MC, WL, AP ONLY): I-stat hCG, quantitative: >2000 m[IU]/mL — ABNORMAL HIGH (ref <5)

## 2018-12-05 MED ORDER — SODIUM CHLORIDE 0.9 % IV SOLN
Freq: Once | INTRAVENOUS | Status: AC
  Administered 2018-12-05: 16:00:00 via INTRAVENOUS

## 2018-12-05 NOTE — Discharge Instructions (Addendum)
Try to avoid narcotics.  Follow-up for pregnancy care as soon as possible.  Return here if needed.
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
## 2018-12-05 NOTE — ED Notes (Signed)
Patient ambulated to restroom and back to room without complication or assistance from staff or device.
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
## 2018-12-05 NOTE — ED Notes (Signed)
ED Provider at bedside. 

## 2019-03-01 DIAGNOSIS — Z8632 Personal history of gestational diabetes: Secondary | ICD-10-CM | POA: Insufficient documentation

## 2019-03-01 DIAGNOSIS — B1920 Unspecified viral hepatitis C without hepatic coma: Secondary | ICD-10-CM | POA: Insufficient documentation

## 2019-03-01 DIAGNOSIS — F1911 Other psychoactive substance abuse, in remission: Secondary | ICD-10-CM | POA: Insufficient documentation

## 2019-03-01 DIAGNOSIS — O09299 Supervision of pregnancy with other poor reproductive or obstetric history, unspecified trimester: Secondary | ICD-10-CM | POA: Insufficient documentation

## 2019-03-09 DIAGNOSIS — B182 Chronic viral hepatitis C: Secondary | ICD-10-CM | POA: Insufficient documentation

## 2019-04-06 DIAGNOSIS — O283 Abnormal ultrasonic finding on antenatal screening of mother: Secondary | ICD-10-CM | POA: Insufficient documentation

## 2019-05-02 DIAGNOSIS — R7309 Other abnormal glucose: Secondary | ICD-10-CM | POA: Insufficient documentation

## 2019-05-02 DIAGNOSIS — O99019 Anemia complicating pregnancy, unspecified trimester: Secondary | ICD-10-CM | POA: Insufficient documentation

## 2019-05-18 DIAGNOSIS — O403XX0 Polyhydramnios, third trimester, not applicable or unspecified: Secondary | ICD-10-CM | POA: Insufficient documentation

## 2019-07-15 NOTE — L&D Delivery Note (Addendum)
OB/GYN Faculty Practice Delivery Note  Carolyn [name] is a 24 y.o. [id number] s/p SVD at [redacted]w[redacted]d. She was admitted for labor, SROM.   ROM: 9h 53m with clear fluid GBS Status:  --/NEGATIVE (01/12 1202)  Labor Progress: Patient presented to L&D for SROM. Initial SVE: 4. She then progressed to complete with pitocin.   Delivery Date/Time: 1921 Delivery: Called to room and patient was complete and pushing. Head delivered without difficulty. No nuchal cord present. Shoulder delivery with gentle downward traction and body delivered in usual fashion. Infant with spontaneous cry, placed on mother's abdomen, dried and stimulated. Cord clamped x 2 after 1-minute delay, and cut by medical student. Cord blood drawn. Placenta delivered manually by Dr. Adrian Blackwater. Fundus firm with massage and Pitocin. Labia, perineum, vagina, and cervix inspected inspected, intact perinum.  Post placental Lyletta IUD placed by Dr. Adrian Blackwater.  Mother and baby stable and bonding.   Placenta: Sent to L&D Complications: None Lacerations: intact EBL: 450 Analgesia: Epidural   Infant: APGAR (1 MIN): 9   APGAR (5 MINS): 9   APGAR (10 MINS):     Carolyn [name], SNM, RNC-OB  I was present and gloved with the student for the entire delivery. I agree with the note above.   Thressa Sheller DNP, CNM  07/26/19  8:11 PM

## 2019-07-26 ENCOUNTER — Encounter (HOSPITAL_COMMUNITY): Payer: Self-pay | Admitting: *Deleted

## 2019-07-26 ENCOUNTER — Inpatient Hospital Stay (HOSPITAL_COMMUNITY): Payer: Medicaid Other | Admitting: Anesthesiology

## 2019-07-26 ENCOUNTER — Other Ambulatory Visit: Payer: Self-pay

## 2019-07-26 ENCOUNTER — Inpatient Hospital Stay (HOSPITAL_COMMUNITY)
Admission: AD | Admit: 2019-07-26 | Discharge: 2019-07-28 | DRG: 806 | Disposition: A | Discharge status: Home / Self Care | Payer: Medicaid Other | Attending: Family Medicine | Admitting: Family Medicine

## 2019-07-26 DIAGNOSIS — Z3A38 38 weeks gestation of pregnancy: Secondary | ICD-10-CM

## 2019-07-26 DIAGNOSIS — Z20822 Contact with and (suspected) exposure to covid-19: Secondary | ICD-10-CM | POA: Diagnosis present

## 2019-07-26 DIAGNOSIS — O9842 Viral hepatitis complicating childbirth: Secondary | ICD-10-CM | POA: Diagnosis present

## 2019-07-26 DIAGNOSIS — O26893 Other specified pregnancy related conditions, third trimester: Secondary | ICD-10-CM | POA: Diagnosis present

## 2019-07-26 DIAGNOSIS — O1404 Mild to moderate pre-eclampsia, complicating childbirth: Principal | ICD-10-CM | POA: Diagnosis present

## 2019-07-26 DIAGNOSIS — Z30430 Encounter for insertion of intrauterine contraceptive device: Secondary | ICD-10-CM | POA: Diagnosis not present

## 2019-07-26 DIAGNOSIS — Z87891 Personal history of nicotine dependence: Secondary | ICD-10-CM | POA: Diagnosis not present

## 2019-07-26 DIAGNOSIS — B1920 Unspecified viral hepatitis C without hepatic coma: Secondary | ICD-10-CM | POA: Diagnosis present

## 2019-07-26 DIAGNOSIS — O1414 Severe pre-eclampsia complicating childbirth: Secondary | ICD-10-CM | POA: Diagnosis present

## 2019-07-26 LAB — RESPIRATORY PANEL BY RT PCR (FLU A&B, COVID)
Influenza A by PCR: NEGATIVE
Influenza B by PCR: NEGATIVE
SARS Coronavirus 2 by RT PCR: NEGATIVE

## 2019-07-26 LAB — URINALYSIS, ROUTINE W REFLEX MICROSCOPIC
Bacteria, UA: NONE SEEN
Bilirubin Urine: NEGATIVE
Glucose, UA: NEGATIVE mg/dL
Hgb urine dipstick: NEGATIVE
Ketones, ur: NEGATIVE mg/dL
Leukocytes,Ua: NEGATIVE
Nitrite: NEGATIVE
Protein, ur: 100 mg/dL — AB
Specific Gravity, Urine: 1.012 (ref 1.005–1.030)
pH: 7 (ref 5.0–8.0)

## 2019-07-26 LAB — GROUP B STREP BY PCR: Group B strep by PCR: NEGATIVE

## 2019-07-26 LAB — CBC
HCT: 35.9 % — ABNORMAL LOW (ref 36.0–46.0)
Hemoglobin: 11 g/dL — ABNORMAL LOW (ref 12.0–15.0)
MCH: 24.6 pg — ABNORMAL LOW (ref 26.0–34.0)
MCHC: 30.6 g/dL (ref 30.0–36.0)
MCV: 80.1 fL (ref 80.0–100.0)
Platelets: 224 10*3/uL (ref 150–400)
RBC: 4.48 MIL/uL (ref 3.87–5.11)
RDW: 14.4 % (ref 11.5–15.5)
WBC: 6.2 10*3/uL (ref 4.0–10.5)
nRBC: 0 % (ref 0.0–0.2)

## 2019-07-26 LAB — RAPID URINE DRUG SCREEN, HOSP PERFORMED
Amphetamines: POSITIVE — AB
Barbiturates: NOT DETECTED
Benzodiazepines: NOT DETECTED
Cocaine: NOT DETECTED
Opiates: NOT DETECTED
Tetrahydrocannabinol: NOT DETECTED

## 2019-07-26 LAB — OB RESULTS CONSOLE GBS: GBS: NEGATIVE

## 2019-07-26 LAB — PROTEIN / CREATININE RATIO, URINE
Creatinine, Urine: 192.49 mg/dL
Protein Creatinine Ratio: 0.42 mg/mg{Cre} — ABNORMAL HIGH (ref 0.00–0.15)
Total Protein, Urine: 80 mg/dL

## 2019-07-26 LAB — TYPE AND SCREEN
ABO/RH(D): O POS
Antibody Screen: NEGATIVE

## 2019-07-26 LAB — HEPATITIS B SURFACE ANTIGEN: Hepatitis B Surface Ag: NONREACTIVE

## 2019-07-26 LAB — POCT FERN TEST: POCT Fern Test: POSITIVE

## 2019-07-26 LAB — ABO/RH: ABO/RH(D): O POS

## 2019-07-26 MED ORDER — SENNOSIDES-DOCUSATE SODIUM 8.6-50 MG PO TABS
2.0000 | ORAL_TABLET | ORAL | Status: DC
  Administered 2019-07-26 – 2019-07-27 (×2): 2 via ORAL
  Filled 2019-07-26 (×2): qty 2

## 2019-07-26 MED ORDER — DIPHENHYDRAMINE HCL 50 MG/ML IJ SOLN: 12.5000 mg | INTRAMUSCULAR | Status: DC | PRN

## 2019-07-26 MED ORDER — PRENATAL MULTIVITAMIN CH
1.0000 | ORAL_TABLET | Freq: Every day | ORAL | Status: DC
  Administered 2019-07-27: 11:00:00 1 via ORAL
  Filled 2019-07-26 (×2): qty 1

## 2019-07-26 MED ORDER — OXYCODONE-ACETAMINOPHEN 5-325 MG PO TABS: 2.0000 | ORAL_TABLET | ORAL | Status: DC | PRN

## 2019-07-26 MED ORDER — ONDANSETRON HCL 4 MG/2ML IJ SOLN: 4.0000 mg | INTRAMUSCULAR | Status: DC | PRN

## 2019-07-26 MED ORDER — SODIUM CHLORIDE (PF) 0.9 % IJ SOLN
INTRAMUSCULAR | Status: DC | PRN
  Administered 2019-07-26: 12 mL/h via EPIDURAL

## 2019-07-26 MED ORDER — OXYCODONE-ACETAMINOPHEN 5-325 MG PO TABS: 1.0000 | ORAL_TABLET | ORAL | Status: DC | PRN

## 2019-07-26 MED ORDER — LACTATED RINGERS IV SOLN: 500.0000 mL | INTRAVENOUS | Status: DC | PRN

## 2019-07-26 MED ORDER — ACETAMINOPHEN 325 MG PO TABS: 650.0000 mg | ORAL_TABLET | ORAL | Status: DC | PRN

## 2019-07-26 MED ORDER — OXYTOCIN 40 UNITS IN NORMAL SALINE INFUSION - SIMPLE MED
1.0000 m[IU]/min | INTRAVENOUS | Status: DC
  Administered 2019-07-26: 2 m[IU]/min via INTRAVENOUS

## 2019-07-26 MED ORDER — FENTANYL CITRATE (PF) 100 MCG/2ML IJ SOLN: 50.0000 ug | INTRAMUSCULAR | Status: DC | PRN

## 2019-07-26 MED ORDER — TERBUTALINE SULFATE 1 MG/ML IJ SOLN: 0.2500 mg | Freq: Once | INTRAMUSCULAR | Status: DC | PRN

## 2019-07-26 MED ORDER — TETANUS-DIPHTH-ACELL PERTUSSIS 5-2.5-18.5 LF-MCG/0.5 IM SUSP: 0.5000 mL | Freq: Once | INTRAMUSCULAR | Status: DC

## 2019-07-26 MED ORDER — LIDOCAINE-EPINEPHRINE (PF) 2 %-1:200000 IJ SOLN
INTRAMUSCULAR | Status: DC | PRN
  Administered 2019-07-26 (×2): 2 mL via EPIDURAL

## 2019-07-26 MED ORDER — LIDOCAINE HCL (PF) 1 % IJ SOLN: 30.0000 mL | INTRAMUSCULAR | Status: DC | PRN

## 2019-07-26 MED ORDER — PHENYLEPHRINE 40 MCG/ML (10ML) SYRINGE FOR IV PUSH (FOR BLOOD PRESSURE SUPPORT): 80.0000 ug | PREFILLED_SYRINGE | INTRAVENOUS | Status: DC | PRN

## 2019-07-26 MED ORDER — ONDANSETRON HCL 4 MG PO TABS: 4.0000 mg | ORAL_TABLET | ORAL | Status: DC | PRN

## 2019-07-26 MED ORDER — OXYTOCIN 40 UNITS IN NORMAL SALINE INFUSION - SIMPLE MED
2.5000 [IU]/h | INTRAVENOUS | Status: DC
  Administered 2019-07-26: 20:00:00 2.5 [IU]/h via INTRAVENOUS
  Filled 2019-07-26: qty 1000

## 2019-07-26 MED ORDER — FENTANYL-BUPIVACAINE-NACL 0.5-0.125-0.9 MG/250ML-% EP SOLN
12.0000 mL/h | EPIDURAL | Status: DC | PRN
  Filled 2019-07-26: qty 250

## 2019-07-26 MED ORDER — EPHEDRINE 5 MG/ML INJ: 10.0000 mg | INTRAVENOUS | Status: DC | PRN

## 2019-07-26 MED ORDER — DIBUCAINE (PERIANAL) 1 % EX OINT: 1.0000 "application " | TOPICAL_OINTMENT | CUTANEOUS | Status: DC | PRN

## 2019-07-26 MED ORDER — CEFAZOLIN SODIUM-DEXTROSE 2-4 GM/100ML-% IV SOLN
2.0000 g | Freq: Once | INTRAVENOUS | Status: AC
  Administered 2019-07-26: 2 g via INTRAVENOUS
  Filled 2019-07-26: qty 100

## 2019-07-26 MED ORDER — SOD CITRATE-CITRIC ACID 500-334 MG/5ML PO SOLN: 30.0000 mL | ORAL | Status: DC | PRN

## 2019-07-26 MED ORDER — BENZOCAINE-MENTHOL 20-0.5 % EX AERO: 1.0000 "application " | INHALATION_SPRAY | CUTANEOUS | Status: DC | PRN

## 2019-07-26 MED ORDER — WITCH HAZEL-GLYCERIN EX PADS: 1.0000 "application " | MEDICATED_PAD | CUTANEOUS | Status: DC | PRN

## 2019-07-26 MED ORDER — ONDANSETRON HCL 4 MG/2ML IJ SOLN: 4.0000 mg | Freq: Four times a day (QID) | INTRAMUSCULAR | Status: DC | PRN

## 2019-07-26 MED ORDER — LEVONORGESTREL 19.5 MCG/DAY IU IUD
INTRAUTERINE_SYSTEM | Freq: Once | INTRAUTERINE | Status: AC
  Administered 2019-07-26: 1 via INTRAUTERINE
  Filled 2019-07-26: qty 1

## 2019-07-26 MED ORDER — COCONUT OIL OIL: 1.0000 "application " | TOPICAL_OIL | Status: DC | PRN

## 2019-07-26 MED ORDER — LACTATED RINGERS IV SOLN: INTRAVENOUS | Status: DC

## 2019-07-26 MED ORDER — OXYTOCIN BOLUS FROM INFUSION
500.0000 mL | Freq: Once | INTRAVENOUS | Status: AC
  Administered 2019-07-26: 500 mL via INTRAVENOUS

## 2019-07-26 MED ORDER — SIMETHICONE 80 MG PO CHEW: 80.0000 mg | CHEWABLE_TABLET | ORAL | Status: DC | PRN

## 2019-07-26 MED ORDER — DIPHENHYDRAMINE HCL 25 MG PO CAPS: 25.0000 mg | ORAL_CAPSULE | Freq: Four times a day (QID) | ORAL | Status: DC | PRN

## 2019-07-26 MED ORDER — IBUPROFEN 600 MG PO TABS
600.0000 mg | ORAL_TABLET | Freq: Four times a day (QID) | ORAL | Status: DC
  Administered 2019-07-26 – 2019-07-28 (×6): 600 mg via ORAL
  Filled 2019-07-26 (×7): qty 1

## 2019-07-26 MED ORDER — LACTATED RINGERS IV SOLN
500.0000 mL | Freq: Once | INTRAVENOUS | Status: AC
  Administered 2019-07-26: 14:00:00 500 mL via INTRAVENOUS

## 2019-07-26 NOTE — Progress Notes (Addendum)
Post Partum Day 1  Subjective: no complaints, up ad lib, voiding, tolerating PO and + flatus  Objective: Blood pressure 138/87, pulse 71, temperature 98.6 F (37 C), temperature source Oral, resp. rate 18, height '4\' 11"'$  (1.499 m), weight 56.6 kg, SpO2 100 %, unknown if currently breastfeeding. Patient Vitals for the past 24 hrs:  BP Temp Temp src Pulse Resp SpO2 Height Weight  07/27/19 0219 138/87 98.6 F (37 C) Oral 71 18 -- -- --  07/26/19 2245 (!) 149/113 98.2 F (36.8 C) Oral 77 18 -- -- --  07/26/19 2128 (!) 141/103 97.8 F (36.6 C) Oral 72 18 100 % -- --  07/26/19 2100 (!) 123/97 -- -- 97 18 -- -- --  07/26/19 2015 (!) 153/97 -- -- 94 18 -- -- --  07/26/19 2000 (!) 139/108 -- -- 92 18 -- -- --  07/26/19 1945 120/87 -- -- 91 18 -- -- --  07/26/19 1930 (!) 123/94 -- -- 80 18 -- -- --   Physical Exam:  General: alert, cooperative and no distress Lochia: appropriate Uterine Fundus: firm DVT Evaluation: No evidence of DVT seen on physical exam.  CBC: Recent Labs  Lab 07/26/19 1235  WBC 6.2  HGB 11.0*  HCT 35.9*  MCV 80.1  PLT 224   UPC 0.42  Assessment/Plan: Carolyn [name] is a [id number] 24 y.o. female. She delivery a viable female  At 38+6 by 18wk [place].   Hx of pre-eclampsia, HTN in labor: Blood pressures continue to be elevated overnight, none in severe range. PCr 0.42 prior to delivery yesterday. Patient denies headaches, changes in vision, RUQ pain.  Order repeat Pre-E labs for this morning  Started Nifedipine XL '30mg'$  daily  #Hx polysubstance abuse, Hx SI, UDS + methamphetamines:  P1 AB P2 TAB 8w P3:  6/181/6 39+5 at [facility] with severe pre-e with Bps on arrival prior to delivery that improved with mag and post delivery. Also, GDM.Note about FOB asking for paternity testing.  P4: 02/28/16: A1GDM. Term, precipitous. Late PNC at 37 weeks. Adoption case.  Current: patient did have [place] for this pregnancy at [facility] per [employer]. Last prenatal appt on  05/18/19. Started care in early second trimester, approx 18 wks by [place].  Follow up CSW for barriers to discharge.   Retained Placenta: Hemoglobin 9.9 from 11.0 this AM. No fevers or tachycardia overnight.  Continue to monitor for signs of anemia, infection.  Continue daily iron  Hep C: follow up outpatient with GI   GDM: Abnormal 3 hour gtt. 185/100/65. No CBGs during this admission.  Will check on this AM. Then, f/u with outpatient PCP.   Rubella nonimmune: MMR PTD  Dispo: DC pending CSW barriers and stable VS.      LOS: 1 day   Wilber Oliphant 07/27/2019, 4:02 AM   GME ATTESTATION:  I saw and evaluated the patient. I agree with the findings and the plan of care as documented in the resident's note. Likely DC tomorrow.  Merilyn Baba, DO OB Fellow, [place] [facility] [place] 07/27/2019 10:11 AM

## 2019-07-26 NOTE — MAU Note (Signed)
0930, started as a little trickle, now a lot more, clear a little yellow. No bleeding. Just starting to contract  Borderline gest Diab, had it with others, hx of PRE E with other preg, +Hep C.

## 2019-07-26 NOTE — Anesthesia Preprocedure Evaluation (Signed)
Anesthesia Evaluation  Patient identified by MRN, date of birth, ID band Patient awake    Reviewed: Allergy & Precautions, NPO status , Patient's Chart, lab work & pertinent test results  Airway Mallampati: II  TM Distance: >3 FB Neck ROM: Full    Dental no notable dental hx.    Pulmonary asthma , former smoker,    Pulmonary exam normal breath sounds clear to auscultation       Cardiovascular negative cardio ROS Normal cardiovascular exam Rhythm:Regular Rate:Normal     Neuro/Psych PSYCHIATRIC DISORDERS Anxiety Depression negative neurological ROS     GI/Hepatic negative GI ROS, (+)     substance abuse  methamphetamine use and IV drug use, Hepatitis -, C  Endo/Other  negative endocrine ROS  Renal/GU negative Renal ROS  negative genitourinary   Musculoskeletal negative musculoskeletal ROS (+)   Abdominal   Peds  Hematology negative hematology ROS (+)   Anesthesia Other Findings   Reproductive/Obstetrics (+) Pregnancy                             Anesthesia Physical Anesthesia Plan  ASA: III  Anesthesia Plan: Epidural   Post-op Pain Management:    Induction:   PONV Risk Score and Plan: Treatment may vary due to age or medical condition  Airway Management Planned: Natural Airway  Additional Equipment:   Intra-op Plan:   Post-operative Plan:   Informed Consent: I have reviewed the patients History and Physical, chart, labs and discussed the procedure including the risks, benefits and alternatives for the proposed anesthesia with the patient or authorized representative who has indicated his/her understanding and acceptance.       Plan Discussed with: Anesthesiologist  Anesthesia Plan Comments: (Patient identified. Risks, benefits, options discussed with patient including but not limited to bleeding, infection, nerve damage, paralysis, failed block, incomplete pain control,  headache, blood pressure changes, nausea, vomiting, reactions to medication, itching, and post partum back pain. Confirmed with bedside nurse the patient's most recent platelet count. Confirmed with the patient that they are not taking any anticoagulation, have any bleeding history or any family history of bleeding disorders. Patient expressed understanding and wishes to proceed. All questions were answered. )        Anesthesia Quick Evaluation

## 2019-07-26 NOTE — Progress Notes (Signed)
Vtx by bedside US. 

## 2019-07-26 NOTE — Anesthesia Procedure Notes (Signed)
Epidural Patient location during procedure: OB Start time: 07/26/2019 2:20 PM End time: 07/26/2019 2:35 PM  Staffing Anesthesiologist: Elmer Picker, MD Performed: anesthesiologist   Preanesthetic Checklist Completed: patient identified, IV checked, risks and benefits discussed, monitors and equipment checked, pre-op evaluation and timeout performed  Epidural Patient position: sitting Prep: DuraPrep and site prepped and draped Patient monitoring: continuous pulse ox, blood pressure, heart rate and cardiac monitor Approach: midline Location: L3-L4 Injection technique: LOR air  Needle:  Needle type: Tuohy  Needle gauge: 17 G Needle length: 9 cm Needle insertion depth: 4 cm Catheter type: closed end flexible Catheter size: 19 Gauge Catheter at skin depth: 9 cm Test dose: negative  Assessment Sensory level: T8 Events: blood not aspirated, injection not painful, no injection resistance, no paresthesia and negative IV test  Additional Notes Patient identified. Risks/Benefits/Options discussed with patient including but not limited to bleeding, infection, nerve damage, paralysis, failed block, incomplete pain control, headache, blood pressure changes, nausea, vomiting, reactions to medication both or allergic, itching and postpartum back pain. Confirmed with bedside nurse the patient's most recent platelet count. Confirmed with patient that they are not currently taking any anticoagulation, have any bleeding history or any family history of bleeding disorders. Patient expressed understanding and wished to proceed. All questions were answered. Sterile technique was used throughout the entire procedure. Please see nursing notes for vital signs. Test dose was given through epidural catheter and negative prior to continuing to dose epidural or start infusion. Warning signs of high block given to the patient including shortness of breath, tingling/numbness in hands, complete motor block, or  any concerning symptoms with instructions to call for help. Patient was given instructions on fall risk and not to get out of bed. All questions and concerns addressed with instructions to call with any issues or inadequate analgesia.  Reason for block:procedure for pain

## 2019-07-26 NOTE — H&P (Addendum)
OBSTETRIC ADMISSION HISTORY AND PHYSICAL  Carolyn [name] is a 24 y.o. female [phone] with IUP at [redacted]w[redacted]d presenting for labor due to SROM. She reports +FMs. No LOF, VB, blurry vision, headaches, peripheral edema, or RUQ pain. She plans on bottle feeding. She requests PP IUD for birth control.  Dating: By LMP --->  Estimated Date of Delivery: 08/03/19  Prenatal History/Complications: No prenatal care  Hx: IV drug user Overdose 11/05/2018 Depression Anxiety Hep C  Past Medical History: Past Medical History:  Diagnosis Date   Allergy    Anxiety    Asthma    childhood   Depression    Drug overdose 11/05/2018   Hepatitis C 12/2017   IV drug user 12/05/2018   Pneumonia    Renal disorder     Past Surgical History: History reviewed. No pertinent surgical history.  Obstetrical History: OB History     Gravida  5   Para  2   Term  2   Preterm      AB  2   Living  2      SAB      TAB  1   Ectopic      Multiple  0   Live Births  2           Social History: Social History   Socioeconomic History   Marital status: Single    Spouse name: Not on file   Number of children: Not on file   Years of education: Not on file   Highest education level: Not on file  Occupational History   Not on file  Tobacco Use   Smoking status: Former Smoker    Packs/day: 1.00    Years: 7.00    Pack years: 7.00    Types: Cigarettes, E-cigarettes   Smokeless tobacco: Never Used  Substance and Sexual Activity   Alcohol use: Not Currently    Alcohol/week: 4.0 standard drinks    Types: 4 Cans of beer per week    Comment: never heavy drinker   Drug use: Not Currently    Types: Methamphetamines, Heroin    Comment: reports stopping early on in pregnancy   Sexual activity: Yes    Birth control/protection: None  Other Topics Concern   Not on file  Social History Narrative   Not on file   Social Determinants of Health   Financial Resource Strain:    Difficulty of Paying  Living Expenses: Not on file  Food Insecurity:    Worried About Programme researcher, broadcasting/film/video in the Last Year: Not on file   [employer] of Food in the Last Year: Not on file  Transportation Needs:    Lack of Transportation (Medical): Not on file   Lack of Transportation (Non-Medical): Not on file  Physical Activity:    Days of Exercise per Week: Not on file   Minutes of Exercise per Session: Not on file  Stress:    Feeling of Stress : Not on file  Social Connections:    Frequency of Communication with Friends and Family: Not on file   Frequency of Social Gatherings with Friends and Family: Not on file   Attends Religious Services: Not on file   Active Member of Clubs or Organizations: Not on file   Attends Banker Meetings: Not on file   Marital Status: Not on file    Family History: Family History  Problem Relation Age of Onset   COPD Mother    Hypertension Father  Diabetes Father     Allergies: Allergies  Allergen Reactions   Advair Diskus [Fluticasone-Salmeterol] Other (See Comments)    Pt states she "blacked out"   Sulfa Antibiotics Other (See Comments)    Infant (unk)   Prednisone     Made her bonkers blackouts   Banana Itching    Medications Prior to Admission  Medication Sig Dispense Refill Last Dose   acetaminophen (TYLENOL) 325 MG tablet Take 650 mg by mouth every 6 (six) hours as needed.   Past Week at Unknown time   Prenatal Vit-Fe Fumarate-FA (MULTIVITAMIN-PRENATAL) 27-0.8 MG TABS tablet Take 1 tablet by mouth daily at 12 noon.   Past Week at Unknown time   acetaminophen (TYLENOL) 500 MG tablet Take 1,000 mg by mouth every 6 (six) hours as needed for moderate pain.      gabapentin (NEURONTIN) 300 MG capsule Take 1 capsule (300 mg total) by mouth 3 (three) times daily. (Patient not taking: Reported on 12/05/2018) 90 capsule 0    hydrOXYzine (ATARAX/VISTARIL) 25 MG tablet Take 1 tablet (25 mg total) by mouth every 6 (six) hours as needed for anxiety.  (Patient not taking: Reported on 12/05/2018) 30 tablet 0      Review of Systems:  All systems reviewed and negative except as stated in HPI  PE: Blood pressure 116/83, pulse (!) 102, temperature 98 F (36.7 C), temperature source Oral, resp. rate 16, height 4\' 11"  (1.499 m), weight 56.6 kg, SpO2 99 %, unknown if currently breastfeeding. General appearance: alert, cooperative and no distress Lungs: regular rate and effort Heart: regular rate  Abdomen: soft, non-tender Extremities: Homans sign is negative, no sign of DVT Presentation: cephalic EFM: 140 bpm, moderate variability,  accels, no decels Toco: Irregular, irritability   Prenatal labs: ABO, Rh: --/--/O POS (01/12 1210) Antibody: NEG (01/12 1210) Rubella:   RPR:    HBsAg: Negative (04/26 1808)  HIV:    GBS:     Prenatal Transfer Tool  Maternal Diabetes: No Genetic Screening: Declined Maternal Ultrasounds/Referrals: Other: no ultrasounds available  Fetal Ultrasounds or other Referrals:  None Maternal Substance Abuse:  No Significant Maternal Medications:  None Significant Maternal Lab Results: Other:  Hep C positive, GBS unknown  Results for orders placed or performed during the hospital encounter of 07/26/19 (from the past 24 hour(s))  POCT fern test   Collection Time: 07/26/19 11:46 AM  Result Value Ref Range   POCT Fern Test Positive = ruptured amniotic membanes   Urinalysis, Routine w reflex microscopic   Collection Time: 07/26/19 12:02 PM  Result Value Ref Range   Color, Urine YELLOW YELLOW   APPearance CLEAR CLEAR   Specific Gravity, Urine 1.012 1.005 - 1.030   pH 7.0 5.0 - 8.0   Glucose, UA NEGATIVE NEGATIVE mg/dL   Hgb urine dipstick NEGATIVE NEGATIVE   Bilirubin Urine NEGATIVE NEGATIVE   Ketones, ur NEGATIVE NEGATIVE mg/dL   Protein, ur 09/23/19 (A) NEGATIVE mg/dL   Nitrite NEGATIVE NEGATIVE   Leukocytes,Ua NEGATIVE NEGATIVE   RBC / HPF 0-5 0 - 5 RBC/hpf   WBC, UA 0-5 0 - 5 WBC/hpf   Bacteria, UA NONE  SEEN NONE SEEN   Squamous Epithelial / LPF 0-5 0 - 5  Urine rapid drug screen (hosp performed)not at [facility]   Collection Time: 07/26/19 12:02 PM  Result Value Ref Range   Opiates NONE DETECTED NONE DETECTED   Cocaine NONE DETECTED NONE DETECTED   Benzodiazepines NONE DETECTED NONE DETECTED   Amphetamines POSITIVE (A)  NONE DETECTED   Tetrahydrocannabinol NONE DETECTED NONE DETECTED   Barbiturates NONE DETECTED NONE DETECTED  Type and screen [place]   Collection Time: 07/26/19 12:10 PM  Result Value Ref Range   ABO/RH(D) O POS    Antibody Screen NEG    Sample Expiration      07/29/2019,2359 Performed at [facility], [place] [address], [place], [place] [address]     Patient Active Problem List   Diagnosis Date Noted   Normal labor 07/26/2019   Heroin abuse ([place]) 11/08/2018   Major depressive disorder, 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 [id number] at [redacted]w[redacted]d here for labor due to SROM  1. Labor: Latent 2. FWB: Reassuring, category 1 3. Pain: Epidural PRN 4. GBS: pending   Plan: Admit to labor and delivery  -SVE -Start pitocin at 2 milli units and increase by 2 milli units every 30 minutes to achieve contractions that are 2-3 minutes are and that are moderate in intensity.  -Continuous fetal monitoring.   Darlin Coco, Student-MidWife  07/26/2019, 1:32 PM   I confirm  that I have verified the information documented in the nurse midwife student's note and that I have also personally reperformed the history, physical exam and all medical decision making activities of this service and have verified that all service and findings are accurately documented in this student's note.   Marcille Buffy DNP, CNM  07/26/19  3:28 PM

## 2019-07-26 NOTE — Progress Notes (Signed)
Carolyn [name] is a 24 y.o. [id number] at [redacted]w[redacted]d by LMP admitted for SROM @ 0930  Subjective: Comfortable post epidural, no complaints  Objective: BP 132/78   Pulse 84   Temp (!) 96.9 F (36.1 C) (Oral)   Resp 18   Ht 4\' 11"  (1.499 m)   Wt 56.6 kg   LMP  (LMP Unknown)   SpO2 99%   BMI 25.19 kg/m  No intake/output data recorded. No intake/output data recorded.  FHT:  FHR: 135 bpm, variability: moderate,  accelerations:  Present,  decelerations:  Absent UC:   irregular, every 2-5 minutes SVE:   Dilation: 3 Effacement (%): 50 Station: -2 Exam by:: [email or web address]: Lab Results  Component Value Date   WBC 6.2 07/26/2019   HGB 11.0 (L) 07/26/2019   HCT 35.9 (L) 07/26/2019   MCV 80.1 07/26/2019   PLT 224 07/26/2019    Assessment / Plan: SROM   -Increase pitocin to achieve an adequate contraction pattern.   Labor: Progressing normally Fetal Wellbeing:  Category I Pain Control:  Epidural I/D:  n/a Anticipated MOD:  NSVD  09/23/2019, SNM 07/26/2019, 3:36 PM

## 2019-07-26 NOTE — Discharge Summary (Addendum)
Postpartum Discharge Summary      Patient Name: Carolyn [name] DOB: 21-Mar-1996 MRN: [id number]  Date of admission: 07/26/2019 Delivering Provider: Marcille Buffy D   Date of discharge: 07/28/2019  Admitting diagnosis: Normal labor [O80, Z37.9] Intrauterine pregnancy: [redacted]w[redacted]d    Secondary diagnosis:  Active Problems:   Normal labor  Additional problems: HTN during labor and postpartum. Started on procardia XL 30 mg with improved pressures after dosing.      Discharge diagnosis: Term Pregnancy Delivered and Preeclampsia (mild)                                                                                                Post partum procedures: Manual removal of placenta, PP IUD placement   Augmentation: Pitocin  Complications: None  Hospital course:  Onset of Labor With Vaginal Delivery     24y.o. yo [id number] admitted in Latent Labor on 07/26/2019. Patient had an uncomplicated labor course as follows:  Membrane Rupture Time/Date: 9:30 AM ,07/26/2019   Intrapartum Procedures: Episiotomy: None [1]                                         Lacerations:  None [1]  Patient had a delivery of a Viable infant. 07/26/2019  Information for the patient's newborn:  WeGizelle, Whetsel0[[id number]]Delivery Method: Vaginal, Spontaneous(Filed from Delivery Summary)     Pateint had an uncomplicated postpartum course.  She is ambulating, tolerating a regular diet, passing flatus, and urinating well. Patient is discharged home in stable condition on 07/28/19.  Delivery time: 7:21 PM    Magnesium Sulfate received: No BMZ received: No Rhophylac:N/A MMR:N/A Transfusion:No  Physical exam  Vitals:   07/27/19 1105 07/27/19 1745 07/27/19 2142 07/28/19 0511  BP: (!) 132/95 (!) 132/95 112/82 121/80  Pulse: 87 73 88 81  Resp:   17 16  Temp: (!) 97.5 F (36.4 C)  98.3 F (36.8 C) 98 F (36.7 C)  TempSrc: Oral  Oral Oral  SpO2:   96%   Weight:      Height:       General: alert,  cooperative and no distress Lochia: appropriate Uterine Fundus: firm Incision: N/A DVT Evaluation: No evidence of DVT seen on physical exam. Labs: Lab Results  Component Value Date   WBC 7.9 07/27/2019   HGB 9.9 (L) 07/27/2019   HCT 31.0 (L) 07/27/2019   MCV 78.1 (L) 07/27/2019   PLT 176 07/27/2019   CMP Latest Ref Rng & Units 07/27/2019  Glucose 70 - 99 mg/dL 91  BUN 6 - 20 mg/dL 5(L)  Creatinine 0.44 - 1.00 mg/dL 0.62  Sodium 135 - 145 mmol/L 138  Potassium 3.5 - 5.1 mmol/L 4.2  Chloride 98 - 111 mmol/L 105  CO2 22 - 32 mmol/L 25  Calcium 8.9 - 10.3 mg/dL 8.4(L)  Total Protein 6.5 - 8.1 g/dL 4.7(L)  Total Bilirubin 0.3 - 1.2 mg/dL 0.5  Alkaline Phos 38 - 126 U/L 160(H)  AST  15 - 41 U/L 33  ALT 0 - 44 U/L 19    Discharge instruction: per After Visit Summary and "Baby and Me Booklet".  After visit meds:  Allergies as of 07/28/2019       Reactions   Advair Diskus [fluticasone-salmeterol] Other (See Comments)   Pt states she "blacked out"   Sulfa Antibiotics Other (See Comments)   Infant (unk)   Prednisone    Made her bonkers blackouts   Banana Itching        Medication List     TAKE these medications    acetaminophen 500 MG tablet Commonly known as: TYLENOL Take 1,000 mg by mouth every 6 (six) hours as needed for moderate pain.   busPIRone 15 MG tablet Commonly known as: BUSPAR Take 15 mg by mouth 2 (two) times daily.   ibuprofen 200 MG tablet Commonly known as: ADVIL Take 400 mg by mouth every 6 (six) hours as needed for headache or moderate pain.   NIFEdipine 30 MG 24 hr tablet Commonly known as: ADALAT CC Take 1 tablet (30 mg total) by mouth daily.   prenatal multivitamin Tabs tablet Take 1 tablet by mouth daily at 12 noon.        Diet: routine diet  Activity: Advance as tolerated. Pelvic rest for 6 weeks.   Outpatient follow up:4 weeks Follow up Appt: Future Appointments  Date Time Provider [place]  08/24/2019  2:15 PM  Burleson, Rona Ravens, NP [facility]   Follow up Visit:    Please schedule this patient for Postpartum visit in: 4 weeks with the following provider: Any provider For C/S patients schedule nurse incision check in weeks 2 weeks: no Low risk pregnancy complicated by:  none Delivery mode:  SVD Anticipated Birth Control:   IUD (had placed in the hospital) will need string check  PP Procedures needed:  none   Schedule Integrated BH visit: no  Newborn Data: Live born female  Birth Weight:  2875 g APGAR: 10, 9  Newborn Delivery   Birth date/time: 07/26/2019 19:21:00 Delivery type:       Baby Feeding: Bottle Disposition:NICU  Attestation:  I confirm that I have verified the information documented in the resident's note and that I have also personally reperformed the physical exam and all medical decision making activities.  Patient was seen and examined by me also Agree with note Vitals stable Labs stable Fundus firm, lochia within normal limits Perineum healing Ext WNL Ready for discharge  Seabron Spates, CNM

## 2019-07-27 LAB — CBC
HCT: 31 % — ABNORMAL LOW (ref 36.0–46.0)
Hemoglobin: 9.9 g/dL — ABNORMAL LOW (ref 12.0–15.0)
MCH: 24.9 pg — ABNORMAL LOW (ref 26.0–34.0)
MCHC: 31.9 g/dL (ref 30.0–36.0)
MCV: 78.1 fL — ABNORMAL LOW (ref 80.0–100.0)
Platelets: 176 10*3/uL (ref 150–400)
RBC: 3.97 MIL/uL (ref 3.87–5.11)
RDW: 14.5 % (ref 11.5–15.5)
WBC: 7.9 10*3/uL (ref 4.0–10.5)
nRBC: 0 % (ref 0.0–0.2)

## 2019-07-27 LAB — COMPREHENSIVE METABOLIC PANEL
ALT: 19 U/L (ref 0–44)
AST: 33 U/L (ref 15–41)
Albumin: 2.2 g/dL — ABNORMAL LOW (ref 3.5–5.0)
Alkaline Phosphatase: 160 U/L — ABNORMAL HIGH (ref 38–126)
Anion gap: 8 (ref 5–15)
BUN: 5 mg/dL — ABNORMAL LOW (ref 6–20)
CO2: 25 mmol/L (ref 22–32)
Calcium: 8.4 mg/dL — ABNORMAL LOW (ref 8.9–10.3)
Chloride: 105 mmol/L (ref 98–111)
Creatinine, Ser: 0.62 mg/dL (ref 0.44–1.00)
GFR calc Af Amer: >60 mL/min (ref >60)
GFR calc non Af Amer: >60 mL/min (ref >60)
Glucose, Bld: 91 mg/dL (ref 70–99)
Potassium: 4.2 mmol/L (ref 3.5–5.1)
Sodium: 138 mmol/L (ref 135–145)
Total Bilirubin: 0.5 mg/dL (ref 0.3–1.2)
Total Protein: 4.7 g/dL — ABNORMAL LOW (ref 6.5–8.1)

## 2019-07-27 LAB — RUBELLA SCREEN: Rubella: <0.9 index — ABNORMAL LOW (ref >0.99)

## 2019-07-27 LAB — GC/CHLAMYDIA PROBE AMP (~~LOC~~) NOT AT ARMC
Chlamydia: NEGATIVE
Comment: NEGATIVE
Comment: NORMAL
Neisseria Gonorrhea: NEGATIVE

## 2019-07-27 LAB — GLUCOSE, CAPILLARY: Glucose-Capillary: 91 mg/dL (ref 70–99)

## 2019-07-27 LAB — PROTEIN / CREATININE RATIO, URINE
Creatinine, Urine: 139.87 mg/dL
Protein Creatinine Ratio: 0.48 mg/mg{Cre} — ABNORMAL HIGH (ref 0.00–0.15)
Total Protein, Urine: 67 mg/dL

## 2019-07-27 LAB — RPR: RPR Ser Ql: NONREACTIVE

## 2019-07-27 MED ORDER — NIFEDIPINE ER OSMOTIC RELEASE 30 MG PO TB24
30.0000 mg | ORAL_TABLET | Freq: Every day | ORAL | Status: DC
  Administered 2019-07-27 – 2019-07-28 (×2): 30 mg via ORAL
  Filled 2019-07-27 (×2): qty 1

## 2019-07-27 MED ORDER — FERROUS SULFATE 325 (65 FE) MG PO TABS
325.0000 mg | ORAL_TABLET | ORAL | Status: DC
  Administered 2019-07-27: 325 mg via ORAL
  Filled 2019-07-27: qty 1

## 2019-07-27 NOTE — Progress Notes (Signed)
CSW spoke with CPS worker Marcelino Duster and was advised that she would be out tomorrow morning to see MOB and infant. CSW has updated MOB of this as well as Nursery staff tending to infant. MOB asked CSW if CSW thought they (CPS) are going to take her baby? CSW advised MOB that CSW is unable to determine this as CPS has a different way of handling report. MOB reported understanding and expressed that she has not seen infant as "ive bee in here sleeping all day". CSW encouraged MOB to speak with staff to get updates on infant and even stop into Nursery if possible to see infant.    Plan is for CPS to follow up in the morning with MOB.     Carolyn [name], MSW, LCSW [facility] [phone]

## 2019-07-27 NOTE — Progress Notes (Signed)
CSW received call from [place] with [facility] CPS. Marcelino Duster advised CSW that she has been assigned to case and that she would be reaching out to [facility] to address further needs. CPS worker to call CSW back once further plans have been made.      Claude Manges Olaoluwa Grieder, MSW, LCSW [facility] [place] [phone]

## 2019-07-27 NOTE — Anesthesia Postprocedure Evaluation (Signed)
Anesthesia Post Note  Patient: Katrenia Trampe  Procedure(s) Performed: AN AD HOC LABOR EPIDURAL     Patient location during evaluation: Mother Baby Anesthesia Type: Epidural Level of consciousness: awake and alert and oriented Pain management: satisfactory to patient Vital Signs Assessment: post-procedure vital signs reviewed and stable Respiratory status: spontaneous breathing and nonlabored ventilation Cardiovascular status: stable Postop Assessment: no headache, no backache, no signs of nausea or vomiting, adequate PO intake, patient able to bend at knees and able to ambulate (patient up walking) Anesthetic complications: no    Last Vitals:  Vitals:   07/27/19 0219 07/27/19 0623  BP: 138/87 (!) 129/95  Pulse: 71 78  Resp: 18 18  Temp: 37 C 36.8 C  SpO2:      Last Pain:  Vitals:   07/27/19 0715  TempSrc:   PainSc: 0-No pain   Pain Goal:                   Madison Hickman
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
## 2019-07-28 MED ORDER — NIFEDIPINE ER 30 MG PO TB24: 30.0000 mg | ORAL_TABLET | Freq: Every day | ORAL | 0 refills | Status: DC

## 2019-07-28 MED ORDER — MEASLES, MUMPS & RUBELLA VAC IJ SOLR: 0.5000 mL | Freq: Once | INTRAMUSCULAR | Status: DC

## 2019-07-28 MED FILL — NIFEdipine ER 30 MG TB24: 30 | 30 days supply | Qty: 30 | Fill #0

## 2019-07-28 NOTE — Progress Notes (Signed)
CSW notified that CPS had arrived to speak with MOB at bedside. CSW escorted CPS worker Sharyn Lull to MOB's room to complete further needs on CPS's end. CSW notified that after CPS met with MOB, CPS will be speak with her supervisor to determine further needs of infant. CSW escorted Sharyn Lull to NICU to see infant.   CSW also followed up with  MOB after CPS, to address DNA concerns. MOB was Potential FOB were both advised as to where they can get DNA testing done if desired. MOB reported that she would look into this and expressed no other needs.   CSW awaiting aware that there are still barriers to infant discharging home with MOB at this time. CSW will continue to follow up with CPS worker for further needs and updates.      Carolyn [name], MSW, LCSW [facility] [place] [facility] [place] [phone]

## 2019-08-06 ENCOUNTER — Emergency Department (HOSPITAL_COMMUNITY)
Admission: EM | Admit: 2019-08-06 | Discharge: 2019-08-07 | Disposition: A | Discharge status: Home / Self Care | Payer: Medicaid Other | Attending: Emergency Medicine | Admitting: Emergency Medicine

## 2019-08-06 ENCOUNTER — Other Ambulatory Visit: Payer: Self-pay

## 2019-08-06 ENCOUNTER — Encounter (HOSPITAL_COMMUNITY): Payer: Self-pay | Admitting: *Deleted

## 2019-08-06 DIAGNOSIS — B182 Chronic viral hepatitis C: Secondary | ICD-10-CM | POA: Insufficient documentation

## 2019-08-06 DIAGNOSIS — R39198 Other difficulties with micturition: Secondary | ICD-10-CM | POA: Diagnosis present

## 2019-08-06 DIAGNOSIS — Z87891 Personal history of nicotine dependence: Secondary | ICD-10-CM | POA: Insufficient documentation

## 2019-08-06 DIAGNOSIS — R109 Unspecified abdominal pain: Secondary | ICD-10-CM | POA: Diagnosis not present

## 2019-08-06 LAB — CBC
HCT: 37.4 % (ref 36.0–46.0)
Hemoglobin: 11.3 g/dL — ABNORMAL LOW (ref 12.0–15.0)
MCH: 24.6 pg — ABNORMAL LOW (ref 26.0–34.0)
MCHC: 30.2 g/dL (ref 30.0–36.0)
MCV: 81.5 fL (ref 80.0–100.0)
Platelets: 387 10*3/uL (ref 150–400)
RBC: 4.59 MIL/uL (ref 3.87–5.11)
RDW: 16.1 % — ABNORMAL HIGH (ref 11.5–15.5)
WBC: 9 10*3/uL (ref 4.0–10.5)
nRBC: 0 % (ref 0.0–0.2)

## 2019-08-06 LAB — COMPREHENSIVE METABOLIC PANEL
ALT: 46 U/L — ABNORMAL HIGH (ref 0–44)
AST: 41 U/L (ref 15–41)
Albumin: 3.1 g/dL — ABNORMAL LOW (ref 3.5–5.0)
Alkaline Phosphatase: 120 U/L (ref 38–126)
Anion gap: 9 (ref 5–15)
BUN: 7 mg/dL (ref 6–20)
CO2: 28 mmol/L (ref 22–32)
Calcium: 8.8 mg/dL — ABNORMAL LOW (ref 8.9–10.3)
Chloride: 100 mmol/L (ref 98–111)
Creatinine, Ser: 0.71 mg/dL (ref 0.44–1.00)
GFR calc Af Amer: >60 mL/min (ref >60)
GFR calc non Af Amer: >60 mL/min (ref >60)
Glucose, Bld: 84 mg/dL (ref 70–99)
Potassium: 4 mmol/L (ref 3.5–5.1)
Sodium: 137 mmol/L (ref 135–145)
Total Bilirubin: 0.5 mg/dL (ref 0.3–1.2)
Total Protein: 6.4 g/dL — ABNORMAL LOW (ref 6.5–8.1)

## 2019-08-06 LAB — LIPASE, BLOOD: Lipase: 20 U/L (ref 11–51)

## 2019-08-06 MED ORDER — SODIUM CHLORIDE 0.9% FLUSH: 3.0000 mL | Freq: Once | INTRAVENOUS | Status: DC

## 2019-08-06 MED ORDER — NIFEDIPINE ER OSMOTIC RELEASE 30 MG PO TB24
30.0000 mg | ORAL_TABLET | Freq: Once | ORAL | Status: AC
  Administered 2019-08-07: 30 mg via ORAL
  Filled 2019-08-06: qty 1

## 2019-08-06 NOTE — ED Provider Notes (Signed)
[facility] EMERGENCY DEPARTMENT Provider Note   CSN: [id number] Arrival date & time: 08/06/19  2145     History Chief Complaint  Patient presents with  . Abdominal Pain    Carolyn [name] is a 24 y.o. female.  The history is provided by the patient and medical records.  Abdominal Pain   24 year old female with history of hepatitis C, IV drug abuse, anxiety, allergies, asthma, presenting to the ED with difficulty urinating over the past 24 hours.  Patient had SVD on 07/26/19 that required manual extraction of placenta, otherwise no complications.  She did have epidural, no issues with that.  Was up and walking soon after, having regular BM's and urination.  No fevers.  States over the past 24 hours she has noticed slower urination, has been unable to urinate since early this morning.  States she does feel the urge to go and feels like her bladder is full.  This morning with urination was a fair amount, did report straining to get it out.  No reports dysuria.  She is not having any significant vaginal bleeding.  No N/V/D.  Hx of frequent UTI's in the past.  No discharge.  Patient did have elevated blood pressure immediately after delivery, was started on Procardia with improvement.  She did not take this today as she has been sleeping majority of the day.  Daughter is still in NICU.  Past Medical History:  Diagnosis Date  . Allergy   . Anxiety   . Asthma    childhood  . Depression   . Drug overdose 11/05/2018  . Hepatitis C 12/2017  . IV drug user 12/05/2018  . Pneumonia   . Renal disorder     Patient Active Problem List   Diagnosis Date Noted  . Normal labor 07/26/2019  . 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 Normal labor and delivery 02/28/2016  .  Irregular contractions 02/07/2016  . Hypertension in pregnancy, preeclampsia, severe, delivered 01/01/2015  . Postpartum care following vaginal delivery 12/31/2014  . Labor and delivery, indication for care 12/30/2014  . Abdominal pain 12/29/2014  . Labor and delivery indication for care or intervention 12/29/2014  . Braxton Hicks contractions 12/05/2014    History reviewed. No pertinent surgical history.   OB History    Gravida  5   Para  3   Term  3   Preterm      AB  2   Living  3     SAB      TAB  1   Ectopic      Multiple  0   Live Births  3           Family History  Problem Relation Age of Onset  . COPD Mother   . Hypertension Father   . Diabetes Father     Social History   Tobacco Use  . Smoking status: Former Smoker    Packs/day: 1.00    Years: 7.00    Pack years: 7.00    Types: Cigarettes, E-cigarettes  . Smokeless tobacco: Never Used  Substance Use Topics  . Alcohol use: Not Currently    Alcohol/week: 4.0 standard drinks    Types: 4 Cans of beer per week    Comment: never heavy drinker  . Drug use: Not Currently    Types:  Methamphetamines, Heroin    Comment: reports stopping early on in pregnancy    Home Medications Prior to Admission medications   Medication Sig Start Date End Date Taking? Authorizing Provider  acetaminophen (TYLENOL) 500 MG tablet Take 1,000 mg by mouth every 6 (six) hours as needed for moderate pain.   Yes [provider]  NIFEdipine (ADALAT CC) 30 MG 24 hr tablet Take 1 tablet (30 mg total) by mouth daily. 07/28/19  Yes Melene Plan, MD    Allergies    Advair diskus [fluticasone-salmeterol], Sulfa antibiotics, Prednisone, and Banana  Review of Systems   Review of Systems  Gastrointestinal: Positive for abdominal pain.  Genitourinary: Positive for difficulty urinating.  All other systems reviewed and are negative.   Physical Exam Updated Vital Signs BP (!) 133/96   Pulse 81   Temp 98.4 F  (36.9 C) (Oral)   Resp 20   Ht 4\' 11"  (1.499 m)   Wt 56.6 kg   LMP 08/06/2019   SpO2 100%   BMI 25.20 kg/m   Physical Exam Vitals and nursing note reviewed.  Constitutional:      Appearance: She is well-developed.  HENT:     Head: Normocephalic and atraumatic.  Eyes:     Conjunctiva/sclera: Conjunctivae normal.     Pupils: Pupils are equal, round, and reactive to light.  Cardiovascular:     Rate and Rhythm: Normal rate and regular rhythm.     Heart sounds: Normal heart sounds.  Pulmonary:     Effort: Pulmonary effort is normal.     Breath sounds: Normal breath sounds.  Abdominal:     General: Bowel sounds are normal.     Palpations: Abdomen is soft.     Tenderness: There is no abdominal tenderness.     Comments: Soft, grossly non-tender  Musculoskeletal:        General: Normal range of motion.     Cervical back: Normal range of motion.  Skin:    General: Skin is warm and dry.  Neurological:     Mental Status: She is alert and oriented to person, place, and time.     ED Results / Procedures / Treatments   Labs (all labs ordered are listed, but only abnormal results are displayed) Labs Reviewed  COMPREHENSIVE METABOLIC PANEL - Abnormal; Notable for the following components:      Result Value   Calcium 8.8 (*)    Total Protein 6.4 (*)    Albumin 3.1 (*)    ALT 46 (*)    All other components within normal limits  CBC - Abnormal; Notable for the following components:   Hemoglobin 11.3 (*)    MCH 24.6 (*)    RDW 16.1 (*)    All other components within normal limits  URINALYSIS, ROUTINE W REFLEX MICROSCOPIC - Abnormal; Notable for the following components:   Leukocytes,Ua TRACE (*)    All other components within normal limits  URINE CULTURE  LIPASE, BLOOD    EKG None  Radiology No results found.  Procedures Procedures (including critical care time)  Medications Ordered in ED Medications  sodium chloride flush (NS) 0.9 % injection 3 mL (has no  administration in time range)  NIFEdipine (PROCARDIA-XL/NIFEDICAL-XL) 24 hr tablet 30 mg (30 mg Oral Given 08/07/19 0046)  cefTRIAXone (ROCEPHIN) 1 g in sodium chloride 0.9 % 100 mL IVPB (0 g Intravenous Stopped 08/07/19 0130)    ED Course  I have reviewed the triage vital signs and the nursing notes.  Pertinent labs & imaging results that were available during my care of the patient were reviewed by me and considered in my medical decision making (see chart for details).    MDM Rules/Calculators/A&P  24 y.o. F here with urinary retention.  S/p SVD on 07/26/19 which required manual extraction of placenta but no other complications.  She did have hypertension postdelivery and has been treated with Procardia, however did not take it today.  States he has not been able to urinate since early this morning despite trying several times.  States she does have the urge but is unable to go.  She is not had any noted fevers.  Does have history of frequent UTIs.  She is afebrile and nontoxic in appearance here.  Some mild fullness over the bladder.  Initial bladder scan with 497 per NT.    12:15 AM Spoke with OB-GYN on call, Dr. Elonda Husky--  As she is 12 days out from delivery, likely not due to epidural.  Bladder may have overfilled and muscle needs to wake back up.  Recommends in and out cath instead of foley, IV Rocephin and d/c home with prophylactic keflex.  Do not necessarily need to wait for void trial but if still having issues will need to follow-up in clinic and have foley placed at that time.  In and out performed, per RN only got about 250cc return before stopped flowing.  Still no further urine return despite repositioning and pressure on the bladder.  Post void residual bladder scan with 500cc. Will give voiding trial here while IV abx infusing.    3:21 AM Patient still unable to urinate despite several attempts.  Feeling increased pressure in her bladder.  Continues to have urge to urinate but  unable to go.  No low back pain or focal neurologic deficits.  States she feels like "her muscles are not able to push the urine out".  At this point has been several hours and still unable to urinate on her own.  She would prefer foley catheter to be placed which seems reasonable at this point.  Will place this, start keflex and have her follow-up with urology/OB-GYN.  Reminded to take her procardia as prescribed-- BP downtrending here after home dose and no evidence of renal impairment or proteinuria to suggest postpartum preeclampsia.  Return here for any new/acute changes.  Final Clinical Impression(s) / ED Diagnoses Final diagnoses:  Difficulty urinating    Rx / DC Orders ED Discharge Orders         Ordered    cephALEXin (KEFLEX) 500 MG capsule  2 times daily     08/07/19 0420           Larene Pickett, PA-C 08/07/19 0630    Maudie Flakes, MD 08/07/19 2256

## 2019-08-06 NOTE — ED Triage Notes (Signed)
The pt is c/o abd pain unrinary frequency with burning on urination for 2-3 days  She just delivered a baby one week ago  She had high bp  Since the delivery

## 2019-08-07 LAB — URINALYSIS, ROUTINE W REFLEX MICROSCOPIC
Bacteria, UA: NONE SEEN
Bilirubin Urine: NEGATIVE
Glucose, UA: NEGATIVE mg/dL
Hgb urine dipstick: NEGATIVE
Ketones, ur: NEGATIVE mg/dL
Nitrite: NEGATIVE
Protein, ur: NEGATIVE mg/dL
Specific Gravity, Urine: 1.011 (ref 1.005–1.030)
pH: 7 (ref 5.0–8.0)

## 2019-08-07 MED ORDER — SODIUM CHLORIDE 0.9 % IV SOLN
1.0000 g | Freq: Once | INTRAVENOUS | Status: AC
  Administered 2019-08-07: 01:00:00 1 g via INTRAVENOUS
  Filled 2019-08-07: qty 10

## 2019-08-07 MED ORDER — CEPHALEXIN 500 MG PO CAPS: 500.0000 mg | ORAL_CAPSULE | Freq: Two times a day (BID) | ORAL | 0 refills | Status: DC

## 2019-08-07 NOTE — Discharge Instructions (Signed)
Take the prescribed medication as directed. Make sure to continue your procardia to keep blood pressure controlled. Follow-up with urology and your OB-GYN regarding the urinary retention. Return to the ED for new or worsening symptoms.

## 2019-08-07 NOTE — ED Notes (Signed)
In and out performed. Post residual bladder scan reveled left in bladder. PA aware

## 2019-08-08 ENCOUNTER — Encounter (HOSPITAL_COMMUNITY): Payer: Self-pay | Admitting: Emergency Medicine

## 2019-08-08 ENCOUNTER — Other Ambulatory Visit: Payer: Self-pay

## 2019-08-08 ENCOUNTER — Emergency Department (HOSPITAL_COMMUNITY)
Admission: EM | Admit: 2019-08-08 | Discharge: 2019-08-08 | Disposition: A | Discharge status: Home / Self Care | Payer: Medicaid Other | Attending: Emergency Medicine | Admitting: Emergency Medicine

## 2019-08-08 DIAGNOSIS — K5900 Constipation, unspecified: Secondary | ICD-10-CM | POA: Diagnosis present

## 2019-08-08 DIAGNOSIS — Z960 Presence of urogenital implants: Secondary | ICD-10-CM | POA: Diagnosis not present

## 2019-08-08 DIAGNOSIS — J45909 Unspecified asthma, uncomplicated: Secondary | ICD-10-CM | POA: Insufficient documentation

## 2019-08-08 DIAGNOSIS — Z79899 Other long term (current) drug therapy: Secondary | ICD-10-CM | POA: Diagnosis not present

## 2019-08-08 DIAGNOSIS — Z87891 Personal history of nicotine dependence: Secondary | ICD-10-CM | POA: Diagnosis not present

## 2019-08-08 LAB — URINE CULTURE: Culture: NO GROWTH

## 2019-08-08 MED ORDER — POLYETHYLENE GLYCOL 3350 17 G PO PACK: 17.0000 g | PACK | Freq: Every day | ORAL | 0 refills | Status: AC

## 2019-08-08 NOTE — ED Triage Notes (Signed)
Pt. Stated, Its been a week since I booped

## 2019-08-08 NOTE — ED Triage Notes (Signed)
Pt. Stated, I was here on Saturday cause I couldn't pee.I have a catheter in and was sent home for that and to see a Urologist which is suppose to be on Wednesday, but now I can't boop

## 2019-08-08 NOTE — ED Notes (Signed)
Patient verbalizes understanding of discharge instructions. Prescription medication education provided. Opportunity for questioning and answers were provided. All questions answered.  Armband removed by staff, pt discharged from ED. Ambulatory with strong, steady gait

## 2019-08-08 NOTE — Discharge Instructions (Addendum)
Take miralax as directed   Please follow up with your primary care provider within 5-7 days for re-evaluation of your symptoms. If you do not have a primary care provider, information for a healthcare clinic has been provided for you to make arrangements for follow up care. Please return to the emergency department for any new or worsening symptoms.  

## 2019-08-08 NOTE — ED Provider Notes (Signed)
[name] Provider Note   CSN: [id number] Arrival date & time: 08/08/19  1833     History Chief Complaint  Patient presents with  . Constipation    [name] is a 24 y.o. female.  HPI   Pt is a 24 y/o female with a h/o anemia, anxiety, asthma, depression, IVDU, who presents to the ED today for eval of constipation for the last week. States that the last time she had a BM was when she was in the [name] after delivering her daughter. States her stool was hard when she went then and that they are usually hard.  She does have a h/o constipation. She states she has been taking a stool softener without relief.  States she is having some encopresis and rectal pain. She can feel the need to go and denies any saddle anesthesia or BLE numbness. Denies back pain. Denies NV, headache, fever, abd pain, chills and night sweats.   Of note she recently had Foley placed due to urinary retention following her epidural.  Past Medical History:  Diagnosis Date  . Allergy   . Anxiety   . Asthma    childhood  . Depression   . Drug overdose 11/05/2018  . Hepatitis C 12/2017  . IV drug user 12/05/2018  . Pneumonia   . Renal disorder     Patient Active Problem List   Diagnosis Date Noted  . Normal labor 07/26/2019  . 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 Normal labor and delivery 02/28/2016  . Irregular contractions 02/07/2016  . Hypertension in pregnancy, preeclampsia, severe, delivered 01/01/2015  . Postpartum care following vaginal delivery 12/31/2014  . Labor and delivery, indication for care 12/30/2014  . Abdominal pain 12/29/2014  . Labor and delivery indication for care or intervention 12/29/2014  . Braxton Hicks contractions  12/05/2014    History reviewed. No pertinent surgical history.   OB History    Gravida  5   Para  3   Term  3   Preterm      AB  2   Living  3     SAB      TAB  1   Ectopic      Multiple  0   Live Births  3           Family History  Problem Relation Age of Onset  . COPD Mother   . Hypertension Father   . Diabetes Father     Social History   Tobacco Use  . Smoking status: Former Smoker    Packs/day: 1.00    Years: 7.00    Pack years: 7.00    Types: Cigarettes, E-cigarettes  . Smokeless tobacco: Never Used  Substance Use Topics  . Alcohol use: Not Currently    Alcohol/week: 4.0 standard drinks    Types: 4 Cans of beer per week    Comment: never heavy drinker  . Drug use: Not Currently    Types: Methamphetamines, Heroin    Comment: reports stopping early on in pregnancy    Home Medications Prior to Admission medications   Medication Sig Start Date End Date Taking? Authorizing Provider  acetaminophen (TYLENOL) 500 MG tablet Take 1,000 mg by mouth every 6 (six) hours as needed for moderate pain.    [provider]  cephALEXin (  KEFLEX) 500 MG capsule Take 1 capsule (500 mg total) by mouth 2 (two) times daily. 08/07/19   Garlon Hatchet, PA-C  NIFEdipine (ADALAT CC) 30 MG 24 hr tablet Take 1 tablet (30 mg total) by mouth daily. 07/28/19   Melene Plan, MD  polyethylene glycol (MIRALAX) 17 g packet Take 17 g by mouth daily. Dissolve one cap full in solution (water, gatorade, etc.) and administer once cap-full daily. You may titrate up daily by 1 cap-full until the patient is having pudding consistency of stools. After the patient is able to start passing softer stools they will need to be on 1/2 cap-full daily for 2 weeks. 08/08/19   [name], PA-C    Allergies    Advair diskus [fluticasone-salmeterol], Sulfa antibiotics, Prednisone, and Banana  Review of Systems   Review of Systems  Constitutional: Negative for fever.  HENT: Negative  for ear pain and sore throat.   Eyes: Negative for visual disturbance.  Respiratory: Negative for cough and shortness of breath.   Cardiovascular: Negative for chest pain.  Gastrointestinal: Positive for constipation. Negative for abdominal pain, blood in stool, diarrhea, nausea and vomiting.  Genitourinary: Negative for hematuria.  Musculoskeletal: Negative for arthralgias and back pain.  Skin: Negative for color change and rash.  Neurological: Negative for seizures and syncope.  All other systems reviewed and are negative.   Physical Exam Updated Vital Signs BP (!) 119/96 (BP Location: Right Arm)   Pulse 76   Temp 98.4 F (36.9 C) (Oral)   Resp 18   LMP 08/06/2019   SpO2 98%   Physical Exam Vitals and nursing note reviewed.  Constitutional:      General: She is not in acute distress.    Appearance: She is well-developed.  HENT:     Head: Normocephalic and atraumatic.  Eyes:     Conjunctiva/sclera: Conjunctivae normal.  Cardiovascular:     Rate and Rhythm: Normal rate and regular rhythm.     Heart sounds: No murmur.  Pulmonary:     Effort: Pulmonary effort is normal. No respiratory distress.     Breath sounds: Normal breath sounds.  Abdominal:     General: Bowel sounds are normal.     Palpations: Abdomen is soft.     Tenderness: There is no abdominal tenderness.  Genitourinary:    Comments: Chaperone present. DRE performed. Pt had good rectal tone. There was a large amount of firm light brown stool present in the rectal vault.  Musculoskeletal:     Cervical back: Neck supple.  Skin:    General: Skin is warm and dry.  Neurological:     Mental Status: She is alert.     ED Results / Procedures / Treatments   Labs (all labs ordered are listed, but only abnormal results are displayed) Labs Reviewed - No data to display  EKG None  Radiology No results found.  Procedures Procedures (including critical care time)  Medications Ordered in ED Medications - No  data to display  ED Course  I have reviewed the triage vital signs and the nursing notes.  Pertinent labs & imaging results that were available during my care of the patient were reviewed by me and considered in my medical decision making (see chart for details).    MDM Rules/Calculators/A&P                     24 year old female presenting for evaluation of constipation.  Has a history of chronic constipation  but has had hard stools for the last week.  Recently delivered a baby about 1 week ago.  She denies any other associated symptoms.  She was seen in the ED a few days ago and had a Foley catheter placed for urinary retention and is supposed to follow-up with urology in 2 days.  She denies any back pain.  She has normal strength of the bilateral lower extremities and has normal sensation.  She has normal sensation about the rectum and has normal rectal tone therefore low suspicion for cauda equina in setting of her urinary retention.  She did have some firm stool in the rectal vault I do suspect that she may have significant constipation.  Her abdomen is soft and nontender she has no complaints of abdominal pain.  I do not feel that she needs labs or further imaging at this time.  I will start her on a stool softener and have her follow-up for new or worsening symptoms.  She voiced understanding of plan and reasons return.  Questions answered.  Patient stable for discharge.   Final Clinical Impression(s) / ED Diagnoses Final diagnoses:  Constipation, unspecified constipation type    Rx / DC Orders ED Discharge Orders         Ordered    polyethylene glycol (MIRALAX) 17 g packet  Daily     08/08/19 2156           Rodney Booze, PA-C 08/08/19 2200    Malvin Johns, MD 08/08/19 2319

## 2019-08-16 IMAGING — CT CT HEAD WITHOUT CONTRAST
4 series · 17 of 47 positions shown, 19 images · non-contrast
Comparison: September 07, 2016

CLINICAL DATA: Pain following fall

EXAM:
CT HEAD WITHOUT CONTRAST
TECHNIQUE: Contiguous axial images were obtained from the base of the skull
through the vertex without intravenous contrast.

[Series 3: head without · axial · non-contrast · 0.40mm/px · z∈[+1198,+1318]mm · 7 of 34 slices shown, 9 images]
[im 5/34  brain]
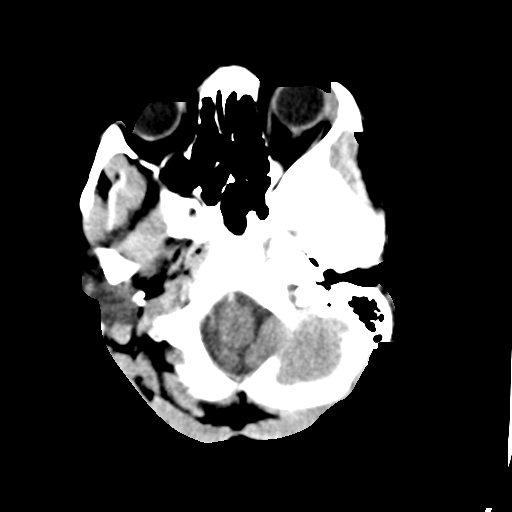
[im 5/34  bone]
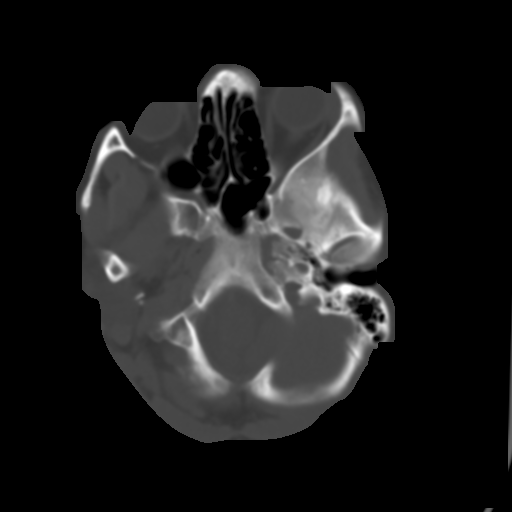
[im 9/34  brain]
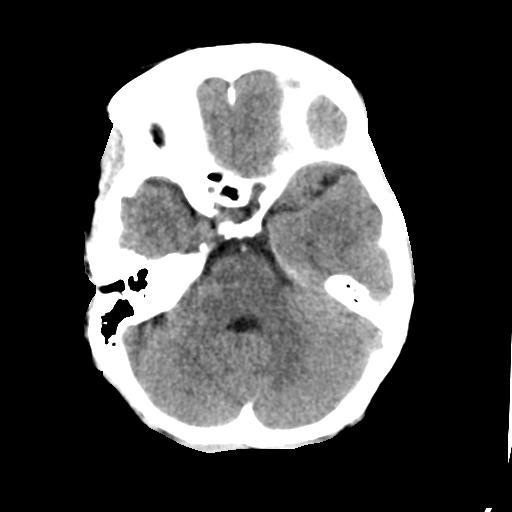
[im 13/34  brain]
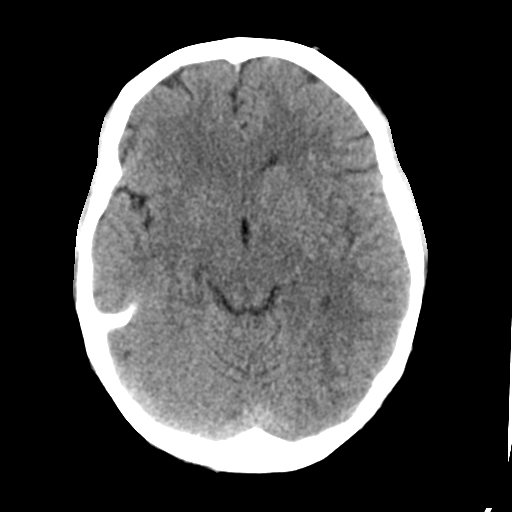
[im 17/34  brain]
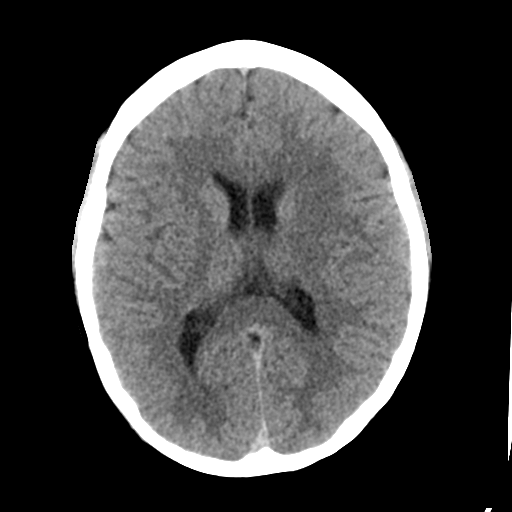
[im 21/34  brain]
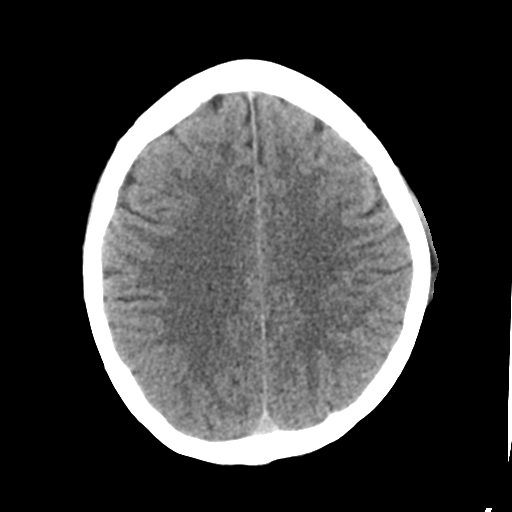
[im 21/34  bone]
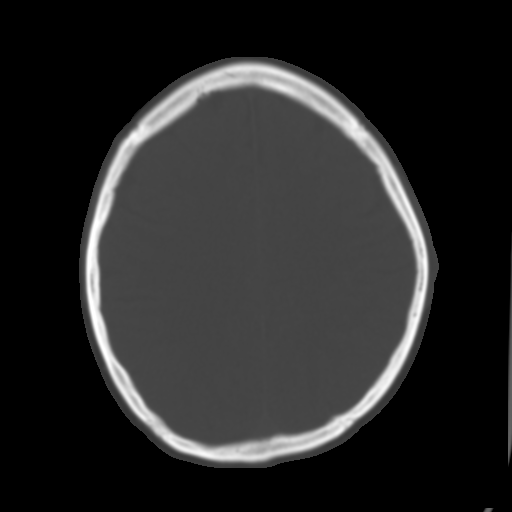
[im 25/34  brain]
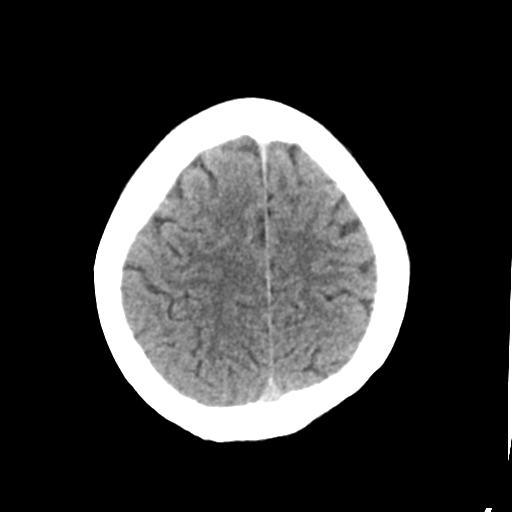
[im 29/34  brain]
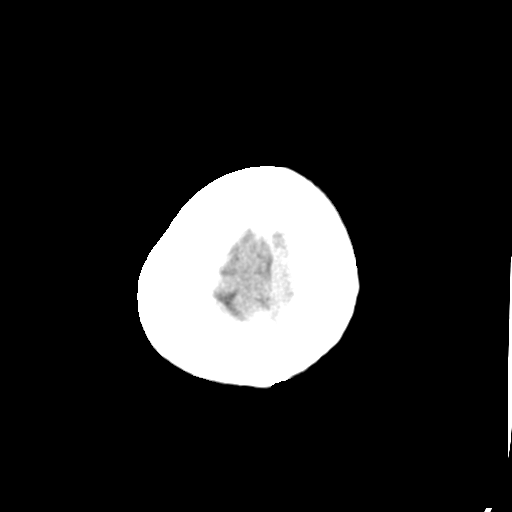

[Series 4: head bone · axial · 0.40mm/px · z∈[+1194,+1252]mm · 4 of 84 slices shown]
[im 9/84  bone]
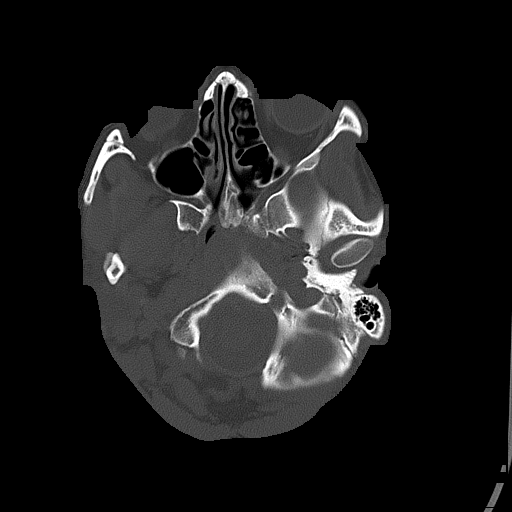
[im 17/84  bone]
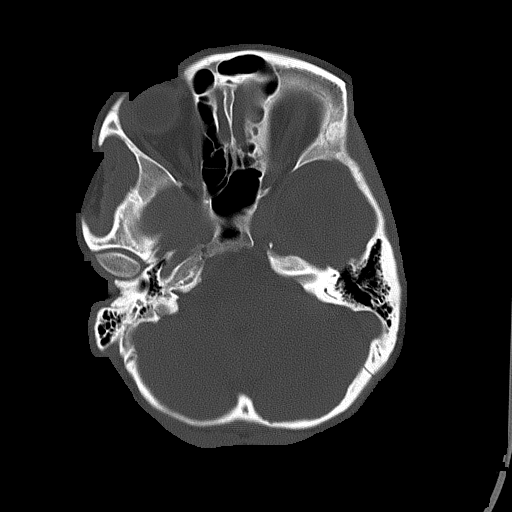
[im 25/84  bone]
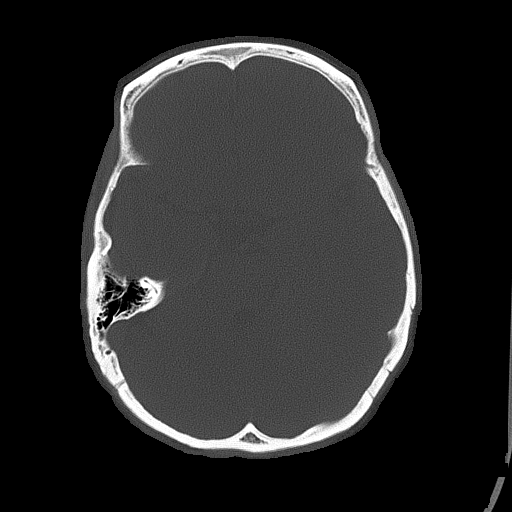
[im 38/84  bone]
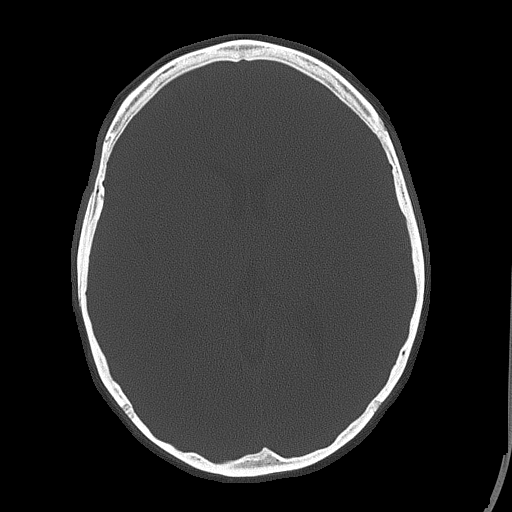

[Series 5: head without cor · coronal · non-contrast · 0.31mm/px · 3 of 62 slices shown]
[im 21/62  brain]
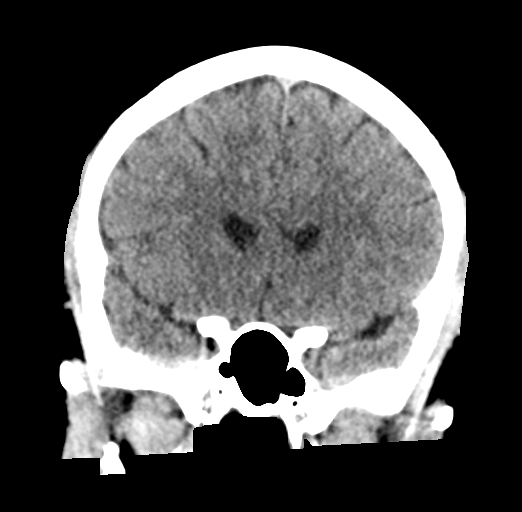
[im 28/62  brain]
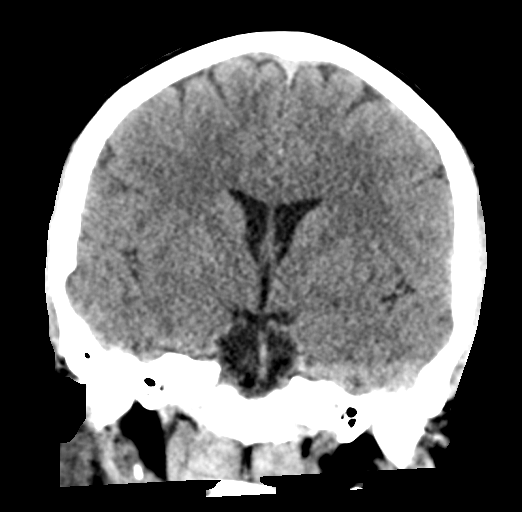
[im 34/62  brain]
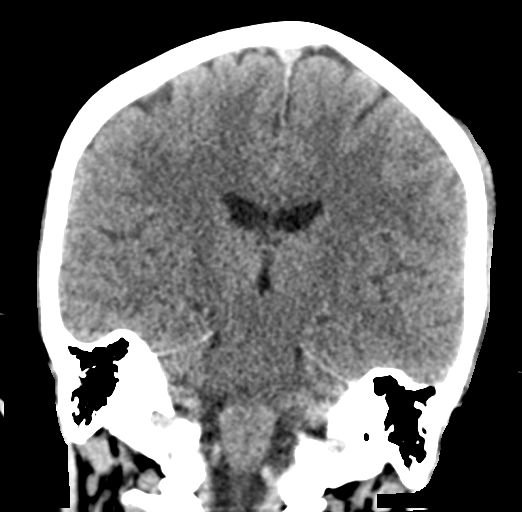

[Series 6: head without sag · sagittal · non-contrast · 0.33mm/px · 3 of 55 slices shown]
[im 19/55  brain]
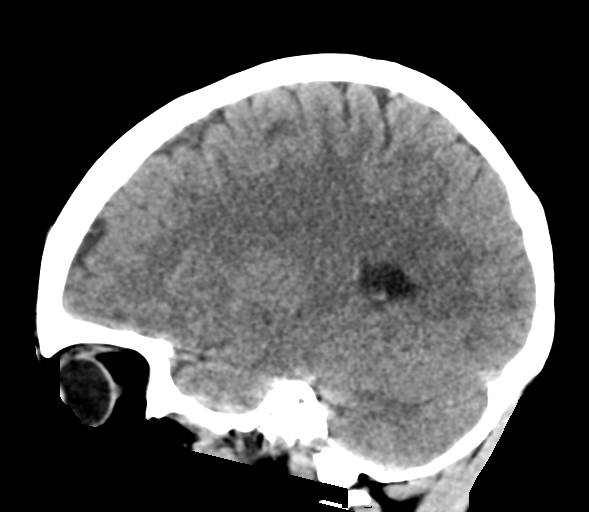
[im 28/55  brain]
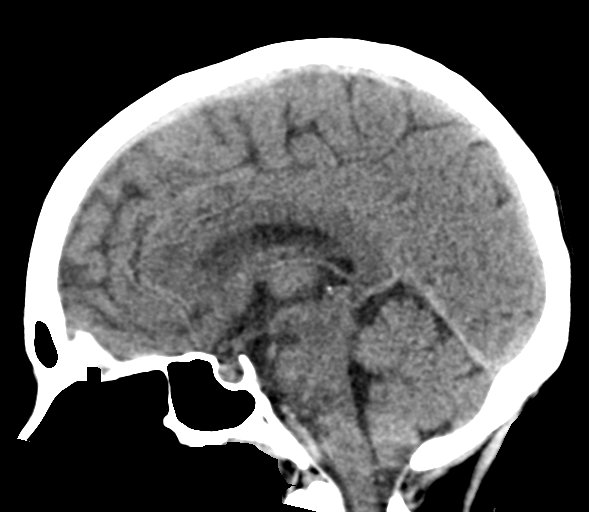
[im 37/55  brain]
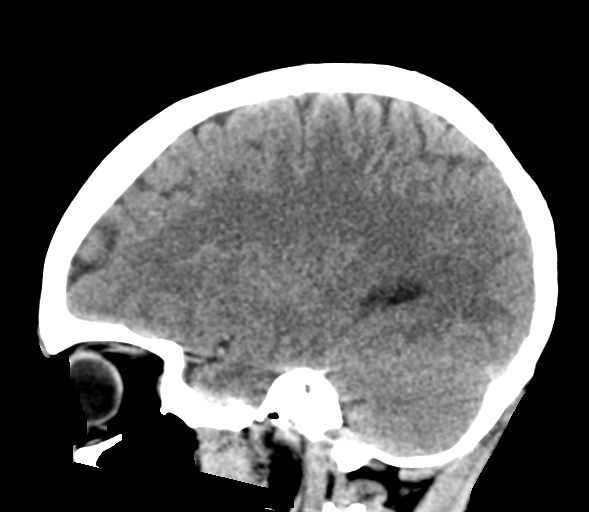

[17 of 47 positions shown; findings below may reference images not displayed]

FINDINGS: Brain: The ventricles are normal in size and configuration. There is
no intracranial mass, hemorrhage, extra-axial fluid collection, or
midline shift. The brain parenchyma appears unremarkable. There is
no appreciable acute infarct.

Vascular: No hyperdense vessel. No appreciable vascular
calcification.

Skull: Bony calvarium appears intact. There is a left
frontal-parietal junction scalp hematoma.

Sinuses/Orbits: There is mucosal thickening in several ethmoid air
cells. Other visualized paranasal sinuses are clear. Orbits appear
symmetric bilaterally.

Other: Mastoid air cells are clear.
IMPRESSION: Brain parenchyma appears unremarkable.  No mass or hemorrhage.

There is a left frontal-parietal junction scalp hematoma. No evident
fracture.

There is mucosal thickening in several ethmoid air cells.

## 2019-08-23 ENCOUNTER — Encounter: Payer: Self-pay | Admitting: Nurse Practitioner

## 2019-08-24 ENCOUNTER — Encounter: Payer: Self-pay | Admitting: Family Medicine

## 2019-08-24 ENCOUNTER — Ambulatory Visit: Payer: Medicaid Other | Admitting: Nurse Practitioner

## 2023-05-04 DIAGNOSIS — B182 Chronic viral hepatitis C: Secondary | ICD-10-CM | POA: Insufficient documentation

## 2023-06-16 ENCOUNTER — Telehealth: Payer: Self-pay | Admitting: Internal Medicine

## 2023-06-16 NOTE — Telephone Encounter (Signed)
Good Afternoon, Dr. Rhea Belton,   Supervising Provider 12/3 PM    We received a call from this patient wishing to schedule an appointment. Patient has been seen once by [facility] which they are currently treating her Hepatitis C which she stated was less then half way done. Patient stated she would like to transfer her care here with [place] for her other GI Symptoms which patient stated would only like to reveal to an actual provider.The only record she has is that one visit and is visible on her chart. Would you please advise on scheduling?    Thank you

## 2023-06-16 NOTE — Telephone Encounter (Signed)
Request received to transfer GI care from outside practice to [facility].  We appreciate the interest in our practice, however at this time due to high demand from patients without established GI providers we cannot accommodate this transfer.  Pt just established with GI at [facility] in Sept 2024

## 2023-06-18 NOTE — Telephone Encounter (Signed)
Left detailed message with previous recommendations.

## 2023-06-19 NOTE — Telephone Encounter (Signed)
Good Afternoon Dr Rhea Belton   Patient called back to f/u on previous transfer request , which she was advised of your previous recommendations.   Patient states at the time of her receiving care from previous provider she had a different insurance policy. And were not in network  not in network. She also expressed that she was closer to our location as well.   Please advise. Thank you

## 2023-06-19 NOTE — Telephone Encounter (Signed)
Same answer, though you can ask another [facility] provider with more availability - but ultimate decision would be the accepting MD

## 2023-06-24 DIAGNOSIS — N3281 Overactive bladder: Secondary | ICD-10-CM | POA: Insufficient documentation

## 2023-08-15 HISTORY — PX: INTERSTIM IMPLANT PLACEMENT: SHX5130

## 2024-04-01 ENCOUNTER — Ambulatory Visit: Payer: Self-pay

## 2024-04-01 NOTE — Telephone Encounter (Signed)
 FYI Only or Action Required?: FYI only for provider.  Patient was last seen in primary care on n/a.  Called Nurse Triage reporting [place].  Symptoms began 4 months ago.  Interventions attempted: Nothing.  Symptoms are: gradually worsening.  Triage Disposition: See PCP When Office is Open (Within 3 Days)  Patient/caregiver understands and will follow disposition?: yes  Pt given NP appt        Copied from CRM #[id number]. Topic: Clinical - Red Word Triage >> Apr 01, 2024  9:20 AM Wess RAMAN wrote: Red Word that prompted transfer to Nurse Triage: Patient Believes she has a cyst forming near pelvic and abdomen area. Lump is there and there is a straining feeling but very little pain and would like it checked. Been off the depo for  a year and a half and no menstrual cycle. [facility] like [facility] appt [facility] Reason for Disposition  [1] Small swelling or lump AND [2] unexplained AND [3] present > 1 week  Answer Assessment - Initial Assessment Questions 1. APPEARANCE of SWELLING: What does it look like?     Round 2. SIZE: How large is the swelling? (e.g., inches, cm; or compare to size of pinhead, tip of pen, eraser, coin, pea, grape, ping pong ball)      size is between a grape and pingpong ball  3. LOCATION: Where is the swelling located?     [place] 4. ONSET: When did the swelling start?     4 months ago  5. COLOR: What color is it? Is there more than one color?     Skin color /no 6. PAIN: Is there any pain? If Yes, ask: How bad is the pain? (Scale 1-10; or mild, moderate, severe)       Mild comes and goes  7. ITCH: Does it itch? If Yes, ask: How bad is the itch?      no 8. CAUSE: What do you think caused the swelling?     cyst 9 OTHER SYMPTOMS: Do you have any other symptoms? (e.g., fever)     No menses x 5 years  has been off BC for no period  Protocols used: Skin Lump or Localized Swelling-A-AH

## 2024-04-12 ENCOUNTER — Ambulatory Visit (INDEPENDENT_AMBULATORY_CARE_PROVIDER_SITE_OTHER)

## 2024-04-12 VITALS — BP 104/64 | HR 75 | Temp 98.6°F | Ht 59.0 in | Wt 116.4 lb

## 2024-04-12 DIAGNOSIS — L659 Nonscarring hair loss, unspecified: Secondary | ICD-10-CM | POA: Diagnosis not present

## 2024-04-12 DIAGNOSIS — R569 Unspecified convulsions: Secondary | ICD-10-CM | POA: Insufficient documentation

## 2024-04-12 DIAGNOSIS — R5383 Other fatigue: Secondary | ICD-10-CM | POA: Diagnosis not present

## 2024-04-12 DIAGNOSIS — Z8632 Personal history of gestational diabetes: Secondary | ICD-10-CM | POA: Diagnosis not present

## 2024-04-12 DIAGNOSIS — N911 Secondary amenorrhea: Secondary | ICD-10-CM | POA: Diagnosis not present

## 2024-04-12 DIAGNOSIS — B182 Chronic viral hepatitis C: Secondary | ICD-10-CM | POA: Insufficient documentation

## 2024-04-12 DIAGNOSIS — F1911 Other psychoactive substance abuse, in remission: Secondary | ICD-10-CM

## 2024-04-12 DIAGNOSIS — N3281 Overactive bladder: Secondary | ICD-10-CM

## 2024-04-12 DIAGNOSIS — Z13220 Encounter for screening for lipoid disorders: Secondary | ICD-10-CM

## 2024-04-12 DIAGNOSIS — J352 Hypertrophy of adenoids: Secondary | ICD-10-CM | POA: Insufficient documentation

## 2024-04-12 NOTE — Assessment & Plan Note (Signed)
 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 On methadone maintenance therapy with a slow tapering plan. Regular counseling at a methadone clinic is ongoing. - Continue methadone maintenance therapy with slow tapering. - Continue regular counseling at the methadone clinic.

## 2024-04-12 NOTE — Assessment & Plan Note (Signed)
 Hair thinning and fatigue Hair thinning accompanied by fatigue. Possible causes include nutritional deficiencies, thyroid  abnormalities, or anemia. No patchy hair loss observed. - Order blood work to evaluate for anemia, B12 deficiency, folic acid deficiency, and thyroid  abnormalities. - Continue vitamin D3 and B12 supplements until blood work results are available.

## 2024-04-12 NOTE — Assessment & Plan Note (Signed)
 Overactive bladder managed with Myrbetriq and InterStim device. Significant improvement in symptoms with no leakage reported. Continues to have frequent urination, possibly due to high water intake. - Continue Myrbetriq and monitor symptoms. - Follow up with urologist as needed.

## 2024-04-12 NOTE — Assessment & Plan Note (Signed)
 Amenorrhea for the past year following discontinuation of birth control. Suspected hormonal imbalance due to prolonged birth control use. Possible causes include ovarian dysfunction or thyroid  abnormalities. Early menopause is considered rare but possible. - Order blood work to assess thyroid  function, menstrual hormones, and other relevant parameters.
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
## 2024-04-13 LAB — COMPREHENSIVE METABOLIC PANEL WITH GFR
ALT: 11 IU/L (ref 0–32)
AST: 20 IU/L (ref 0–40)
Albumin: 4.4 g/dL (ref 4.0–5.0)
Alkaline Phosphatase: 46 IU/L (ref 41–116)
BUN/Creatinine Ratio: 15 (ref 9–23)
BUN: 11 mg/dL (ref 6–20)
Bilirubin Total: 0.3 mg/dL (ref 0.0–1.2)
CO2: 25 mmol/L (ref 20–29)
Calcium: 9.6 mg/dL (ref 8.7–10.2)
Chloride: 102 mmol/L (ref 96–106)
Creatinine, Ser: 0.75 mg/dL (ref 0.57–1.00)
Globulin, Total: 2 g/dL (ref 1.5–4.5)
Glucose: 80 mg/dL (ref 70–99)
Potassium: 4 mmol/L (ref 3.5–5.2)
Sodium: 141 mmol/L (ref 134–144)
Total Protein: 6.4 g/dL (ref 6.0–8.5)
eGFR: 110 mL/min/1.73 (ref >59)

## 2024-04-13 LAB — CBC WITH DIFFERENTIAL/PLATELET
Basophils Absolute: 0 x10E3/uL (ref 0.0–0.2)
Basos: 0 %
EOS (ABSOLUTE): 0.3 x10E3/uL (ref 0.0–0.4)
Eos: 6 %
Hematocrit: 40.2 % (ref 34.0–46.6)
Hemoglobin: 13 g/dL (ref 11.1–15.9)
Immature Grans (Abs): 0 x10E3/uL (ref 0.0–0.1)
Immature Granulocytes: 0 %
Lymphocytes Absolute: 1.7 x10E3/uL (ref 0.7–3.1)
Lymphs: 37 %
MCH: 30.3 pg (ref 26.6–33.0)
MCHC: 32.3 g/dL (ref 31.5–35.7)
MCV: 94 fL (ref 79–97)
Monocytes Absolute: 0.4 x10E3/uL (ref 0.1–0.9)
Monocytes: 10 %
Neutrophils Absolute: 2.1 x10E3/uL (ref 1.4–7.0)
Neutrophils: 47 %
Platelets: 235 x10E3/uL (ref 150–450)
RBC: 4.29 x10E6/uL (ref 3.77–5.28)
RDW: 11.8 % (ref 11.7–15.4)
WBC: 4.5 x10E3/uL (ref 3.4–10.8)

## 2024-04-13 LAB — LIPID PANEL
Chol/HDL Ratio: 3 ratio (ref 0.0–4.4)
Cholesterol, Total: 208 mg/dL — ABNORMAL HIGH (ref 100–199)
HDL: 69 mg/dL (ref >39)
LDL Chol Calc (NIH): 125 mg/dL — ABNORMAL HIGH (ref 0–99)
Triglycerides: 80 mg/dL (ref 0–149)
VLDL Cholesterol Cal: 14 mg/dL (ref 5–40)

## 2024-04-13 LAB — IRON,TIBC AND FERRITIN PANEL
Ferritin: 42 ng/mL (ref 15–150)
Iron Saturation: 25 % (ref 15–55)
Iron: 72 ug/dL (ref 27–159)
Total Iron Binding Capacity: 291 ug/dL (ref 250–450)
UIBC: 219 ug/dL (ref 131–425)

## 2024-04-13 LAB — HEMOGLOBIN A1C
Est. average glucose Bld gHb Est-mCnc: 103 mg/dL
Hgb A1c MFr Bld: 5.2 % (ref 4.8–5.6)

## 2024-04-13 LAB — B12 AND FOLATE PANEL
Folate: 14.4 ng/mL (ref >3.0)
Vitamin B-12: 1956 pg/mL — ABNORMAL HIGH (ref 232–1245)

## 2024-04-13 LAB — TSH: TSH: 3.23 u[IU]/mL (ref 0.450–4.500)

## 2024-04-13 LAB — FSH/LH
FSH: 7.1 m[IU]/mL
LH: 1.2 m[IU]/mL

## 2024-04-18 ENCOUNTER — Ambulatory Visit: Payer: Self-pay

## 2024-04-19 ENCOUNTER — Other Ambulatory Visit: Payer: Self-pay

## 2024-04-19 DIAGNOSIS — N911 Secondary amenorrhea: Secondary | ICD-10-CM

## 2024-04-25 ENCOUNTER — Ambulatory Visit (INDEPENDENT_AMBULATORY_CARE_PROVIDER_SITE_OTHER)
Admission: RE | Admit: 2024-04-25 | Discharge: 2024-04-25 | Disposition: A | Discharge status: Home / Self Care | Source: Ambulatory Visit

## 2024-04-25 DIAGNOSIS — N911 Secondary amenorrhea: Secondary | ICD-10-CM | POA: Diagnosis not present

## 2024-05-02 ENCOUNTER — Ambulatory Visit: Payer: Self-pay

## 2024-05-10 ENCOUNTER — Ambulatory Visit (INDEPENDENT_AMBULATORY_CARE_PROVIDER_SITE_OTHER)

## 2024-05-10 VITALS — BP 106/70 | HR 75 | Temp 98.2°F | Ht 59.0 in | Wt 118.4 lb

## 2024-05-10 DIAGNOSIS — N911 Secondary amenorrhea: Secondary | ICD-10-CM | POA: Diagnosis not present

## 2024-05-10 DIAGNOSIS — R519 Headache, unspecified: Secondary | ICD-10-CM | POA: Insufficient documentation

## 2024-05-10 MED ORDER — SUMATRIPTAN SUCCINATE 25 MG PO TABS: 25.0000 mg | ORAL_TABLET | ORAL | 0 refills | Status: DC | PRN

## 2024-05-10 NOTE — Assessment & Plan Note (Signed)
 amenorrhea for approximately four years, with the last menstrual period occurring over a year ago after discontinuing birth control. Pelvic ultrasound and hormone levels are normal. Prefers not to use hormonal birth control due to past adverse effects. FSH and LH levels are within menstruating range, and uterine lining is normal. - Referred to gynecology for further evaluation and management - Instructed to discuss with gynecologist the preference to avoid hormonal birth control   Orders:   Ambulatory referral to Gynecology

## 2024-05-10 NOTE — Assessment & Plan Note (Signed)
 Post-traumatic migraine headaches Intermittent migraines since a traumatic brain injury in 2016. Headaches originate behind the right eye and can escalate to migraines. Managed with over-the-counter migraine medication, effective if taken at onset. No recent increase in frequency or severity. No need for daily preventive medication at this time. - Prescribed Imitrex for abortive treatment of migraines - Advised taking Imitrex at the onset of headache and repeating if necessary
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
## 2024-08-01 ENCOUNTER — Ambulatory Visit

## 2024-08-01 DIAGNOSIS — F1911 Other psychoactive substance abuse, in remission: Secondary | ICD-10-CM | POA: Diagnosis not present

## 2024-08-01 NOTE — Assessment & Plan Note (Signed)
 Filled paper work for [facility].
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
## 2024-08-01 NOTE — Assessment & Plan Note (Signed)
 Opioid dependence, in remission Opioid dependence in remission for almost five years. Currently on methadone maintenance therapy (MAT) through an outpatient program. No current substance abuse. Attends monthly sessions at [facility] in [place], including counseling and drug screening. - Continue methadone maintenance therapy through [facility]. - Continue monthly outpatient program sessions.
